# Patient Record
Sex: Male | Born: 1945 | Race: White | Hispanic: No | Marital: Married | Smoking: Never smoker
Health system: Southern US, Community
[De-identification: ages and names within clinical notes are randomized; demographics above are authoritative.]

## PROBLEM LIST (undated history)

## (undated) DIAGNOSIS — E785 Hyperlipidemia, unspecified: Secondary | ICD-10-CM

## (undated) DIAGNOSIS — R7303 Prediabetes: Secondary | ICD-10-CM

## (undated) DIAGNOSIS — F419 Anxiety disorder, unspecified: Secondary | ICD-10-CM

## (undated) DIAGNOSIS — I10 Essential (primary) hypertension: Secondary | ICD-10-CM

## (undated) DIAGNOSIS — M940 Chondrocostal junction syndrome [Tietze]: Secondary | ICD-10-CM

## (undated) DIAGNOSIS — M1990 Unspecified osteoarthritis, unspecified site: Secondary | ICD-10-CM

## (undated) HISTORY — DX: Unspecified osteoarthritis, unspecified site: M19.90

## (undated) HISTORY — PX: JOINT REPLACEMENT: SHX530

## (undated) HISTORY — DX: Essential (primary) hypertension: I10

## (undated) HISTORY — DX: Anxiety disorder, unspecified: F41.9

## (undated) HISTORY — PX: CARPAL TUNNEL RELEASE: SHX101

## (undated) HISTORY — PX: TONSILLECTOMY: SUR1361

## (undated) HISTORY — DX: Hyperlipidemia, unspecified: E78.5

---

## 2008-05-31 HISTORY — PX: COLON SURGERY: SHX602

## 2011-11-29 ENCOUNTER — Encounter: Payer: Self-pay | Admitting: Family Medicine

## 2011-11-29 ENCOUNTER — Ambulatory Visit (INDEPENDENT_AMBULATORY_CARE_PROVIDER_SITE_OTHER): Payer: BLUE CROSS/BLUE SHIELD | Admitting: Family Medicine

## 2011-11-29 VITALS — BP 140/92 | HR 65 | Temp 98.5°F | Ht 69.0 in | Wt 189.0 lb

## 2011-11-29 DIAGNOSIS — G8929 Other chronic pain: Secondary | ICD-10-CM

## 2011-11-29 DIAGNOSIS — R200 Anesthesia of skin: Secondary | ICD-10-CM | POA: Insufficient documentation

## 2011-11-29 DIAGNOSIS — R071 Chest pain on breathing: Secondary | ICD-10-CM

## 2011-11-29 DIAGNOSIS — M542 Cervicalgia: Secondary | ICD-10-CM

## 2011-11-29 DIAGNOSIS — R202 Paresthesia of skin: Secondary | ICD-10-CM

## 2011-11-29 DIAGNOSIS — R0789 Other chest pain: Secondary | ICD-10-CM

## 2011-11-29 DIAGNOSIS — R209 Unspecified disturbances of skin sensation: Secondary | ICD-10-CM

## 2011-11-29 DIAGNOSIS — I10 Essential (primary) hypertension: Secondary | ICD-10-CM

## 2011-11-29 MED ORDER — NORTRIPTYLINE HCL 50 MG PO CAPS: 50.0000 mg | ORAL_CAPSULE | Freq: Every day | ORAL | Status: DC

## 2011-11-29 NOTE — Patient Instructions (Signed)
It was nice to meet you.  I want you to try another medication that helps with nerve pain- called nortriptyline.  Take it at bedtime because it can make you sleepy.    I will make a referral for you to see an orthopedist.  The office will contact you with your appointment.  Please plan on an appointment with me in about 6 weeks to see how you are doing.

## 2011-11-29 NOTE — Assessment & Plan Note (Signed)
Description of pain is neuropathic, exam normal.  Rx Nortriptyline.

## 2011-11-29 NOTE — Assessment & Plan Note (Signed)
Strongly encouraged him to take BP medications every day.  Will monitor closely.

## 2011-11-29 NOTE — Assessment & Plan Note (Signed)
Unclear if this is from c-spine vs. Shoulder.  Spurling's neg.  Hx of c-spine arthritis and shoulder replacement.  Will refer to ortho for evaluation.

## 2011-11-29 NOTE — Progress Notes (Signed)
  Subjective:    Patient ID: Russell Peterson, male    DOB: July 07, 1945, 66 y.o.   MRN: [id number]  HPI  Russell Peterson comes in to establish care.  His main complaints are his neck/arm pain and numbness and tingling, as well as chest wall pain and tingling.    He says the neck and left arm pain has been going on for at least two years.  He has had x-rays of his neck that showed arthritis, but has also had nerve conductions studies that did not show a problem.  He describes the pain as burning, and shooting from his neck in to his left arm.  He says his arm often goes numb.  He had a carpel tunnel release for the numbness that did not help.  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  He has had a shoulder replacement in the left shoulder in 2010.    He has also had chest pain for several months.  He says he was recently seen by a cardiologist at [facility] and they did a stress test and said there was nothing wrong with his heart.  He describes the pain as all over his chest, and it feels sharp and tingling.    HTN- forgot to take medications last night, but says it is normally well controlled.    Past Medical History  Diagnosis Date  . Arthritis   . Anxiety   . Diabetes mellitus 2013    pre-diabetic  . Hyperlipidemia   . Hypertension    Family History  Problem Relation Age of Onset  . Heart disease Mother   . Kidney disease Mother   . Diabetes Father   . Stroke Father   . Diabetes Sister   . Hyperlipidemia Sister   . Hypertension Sister   . Stroke Sister   . Hypertension Daughter    History  Substance Use Topics  . Smoking status: Never Smoker   . Smokeless tobacco: Never Used  . Alcohol Use: 2.4 oz/week    4 Cans of beer per week    Review of Systems Pertinent items in HPI.     Objective:   Physical Exam BP 140/92  Pulse 65  Temp 98.5 F (36.9 C) (Oral)  Ht 5\' 9"  (1.753 m)  Wt 189 lb (85.73 kg)  BMI 27.91 kg/m2 General appearance:  alert, cooperative and no distress Neck: no adenopathy, no JVD, supple, symmetrical, trachea midline and thyroid not enlarged, symmetric, no tenderness/mass/nodules Spurling's test is negative.  Full ROM of neck.  Mild TTP in cervical spine. No deformity.  Back: symmetric, no curvature. ROM normal. No CVA tenderness., + mild tenderness to palpation in thoracic and lumbar spine. Lungs: clear to auscultation bilaterally Heart: regular rate and rhythm, S1, S2 normal, no murmur, click, rub or gallop Upper Extremities: 2+ pulses, sensation in tact.  Patient has 5/5 strength in RUE in all fields, but decreased strength in LUE for IR and ER. Full ROM, no rotator cuff impingement.  Lower Extremities: Strength is 5/5 and symmetric, sensation in tact, reflexes normal.       Assessment & Plan:

## 2011-11-29 NOTE — Assessment & Plan Note (Signed)
Will try to obtain outside records.  Will start nortriptyline in addition to gabapentin and tramadol for his pain.

## 2011-11-30 ENCOUNTER — Telehealth: Payer: Self-pay | Admitting: *Deleted

## 2011-11-30 NOTE — Telephone Encounter (Signed)
LMOVM asking pt to give [place] a call back.  When he calls back please let him know that he will need to contact [facility] orthopaedics before an appt can be made.  They need to talk to him about his previous surgery on his shoulder and let him know what records they need.  Marchelle Folks says that this would be easier than trying to relay a message.    [facility] [address], [address] Phone: [phone]

## 2011-12-03 ENCOUNTER — Telehealth: Payer: Self-pay | Admitting: Family Medicine

## 2011-12-03 NOTE — Telephone Encounter (Signed)
Yes ROI was signed at last OV, will wait for notes.

## 2011-12-03 NOTE — Telephone Encounter (Signed)
Russell Peterson,  You had him sign this ROI, right?

## 2011-12-03 NOTE — Telephone Encounter (Signed)
Patient is calling because Orthopaedics said they could not do a ROI to get from [facility].  I asked the patient to come in to [facility] to sign the ROI that we could then send to [facility] and once those records come in, he can either pick them up and take them to Ortho or if they aren't too many pages, then we might be able to fax them.  This is just an [place].

## 2011-12-16 ENCOUNTER — Telehealth: Payer: Self-pay | Admitting: Family Medicine

## 2011-12-16 NOTE — Telephone Encounter (Signed)
Please contact Russell Peterson to inform him if you have received his records from [facility] [place].  If you cannot reach him on his home phone number you can call him on his cell phone [phone].

## 2011-12-20 NOTE — Telephone Encounter (Signed)
Called patient to let him know- received some records from the [place], a series of shoulder x-rays that were done in 2010 after a shoulder replacement.  Informed patient that if he felt there were other records I should have he should contact us or the [place].

## 2011-12-21 ENCOUNTER — Telehealth: Payer: Self-pay | Admitting: Family Medicine

## 2011-12-21 DIAGNOSIS — R202 Paresthesia of skin: Secondary | ICD-10-CM

## 2011-12-21 DIAGNOSIS — Z9889 Other specified postprocedural states: Secondary | ICD-10-CM

## 2011-12-21 DIAGNOSIS — M542 Cervicalgia: Secondary | ICD-10-CM

## 2011-12-21 NOTE — Telephone Encounter (Signed)
The medical records that Dr. Lula Olszewski received on his shoulder xray from 2010 are the only ones that she will need to move forward with the referral to the Orthopaedic Surgeon.

## 2011-12-27 DIAGNOSIS — Z9889 Other specified postprocedural states: Secondary | ICD-10-CM | POA: Insufficient documentation

## 2011-12-27 NOTE — Telephone Encounter (Signed)
Referral has been made.  I am not scanning his shoulder x-rays as I do not think they will add to his care.

## 2012-03-31 ENCOUNTER — Other Ambulatory Visit: Payer: Self-pay | Admitting: Orthopedic Surgery

## 2012-03-31 DIAGNOSIS — M542 Cervicalgia: Secondary | ICD-10-CM

## 2012-04-01 ENCOUNTER — Ambulatory Visit
Admission: RE | Admit: 2012-04-01 | Discharge: 2012-04-01 | Disposition: A | Discharge status: Home / Self Care | Payer: Medicare Other | Source: Ambulatory Visit | Attending: Orthopedic Surgery | Admitting: Orthopedic Surgery

## 2012-04-01 ENCOUNTER — Other Ambulatory Visit: Payer: Self-pay | Admitting: Orthopedic Surgery

## 2012-04-01 DIAGNOSIS — M542 Cervicalgia: Secondary | ICD-10-CM

## 2012-04-01 DIAGNOSIS — IMO0002 Reserved for concepts with insufficient information to code with codable children: Secondary | ICD-10-CM

## 2012-04-05 ENCOUNTER — Other Ambulatory Visit: Payer: Self-pay | Admitting: Orthopedic Surgery

## 2012-04-19 ENCOUNTER — Encounter (HOSPITAL_COMMUNITY): Payer: Self-pay | Admitting: Pharmacy Technician

## 2012-04-20 ENCOUNTER — Encounter (HOSPITAL_COMMUNITY): Payer: Self-pay

## 2012-04-20 ENCOUNTER — Encounter (HOSPITAL_COMMUNITY)
Admission: RE | Admit: 2012-04-20 | Discharge: 2012-04-20 | Disposition: A | Discharge status: Home / Self Care | Payer: Medicare Other | Source: Ambulatory Visit | Attending: Orthopedic Surgery | Admitting: Orthopedic Surgery

## 2012-04-20 LAB — CBC WITH DIFFERENTIAL/PLATELET
Eosinophils Absolute: 0.3 10*3/uL (ref 0.0–0.7)
Eosinophils Relative: 2 % (ref 0–5)
HCT: 46.8 % (ref 39.0–52.0)
Lymphocytes Relative: 22 % (ref 12–46)
Lymphs Abs: 2.3 10*3/uL (ref 0.7–4.0)
MCH: 29.7 pg (ref 26.0–34.0)
MCV: 86.2 fL (ref 78.0–100.0)
Monocytes Absolute: 0.7 10*3/uL (ref 0.1–1.0)
RBC: 5.43 MIL/uL (ref 4.22–5.81)
RDW: 12.5 % (ref 11.5–15.5)
WBC: 10.6 10*3/uL — ABNORMAL HIGH (ref 4.0–10.5)

## 2012-04-20 LAB — URINALYSIS, ROUTINE W REFLEX MICROSCOPIC
Bilirubin Urine: NEGATIVE
Glucose, UA: NEGATIVE mg/dL
Hgb urine dipstick: NEGATIVE
Ketones, ur: NEGATIVE mg/dL
Protein, ur: NEGATIVE mg/dL

## 2012-04-20 LAB — COMPREHENSIVE METABOLIC PANEL
CO2: 31 mEq/L (ref 19–32)
Calcium: 10.3 mg/dL (ref 8.4–10.5)
Creatinine, Ser: 1 mg/dL (ref 0.50–1.35)
GFR calc Af Amer: 89 mL/min — ABNORMAL LOW (ref >90)
GFR calc non Af Amer: 77 mL/min — ABNORMAL LOW (ref >90)
Glucose, Bld: 94 mg/dL (ref 70–99)

## 2012-04-20 LAB — ABO/RH: ABO/RH(D): B POS

## 2012-04-20 LAB — PROTIME-INR
INR: 0.95 (ref 0.00–1.49)
Prothrombin Time: 12.6 seconds (ref 11.6–15.2)

## 2012-04-20 LAB — TYPE AND SCREEN: ABO/RH(D): B POS

## 2012-04-20 NOTE — Pre-Procedure Instructions (Signed)
20 Russell Peterson  04/20/2012   Your procedure is scheduled on:  04/26/12  Report to [facility] at 840 AM.  Call this number if you have problems the morning of surgery: [phone]   Remember:   Do not eat food:After Midnight.    Take these medicines the morning of surgery with A SIP OF WATER: norvasc,neurontin,ultram   Do not wear jewelry, make-up or nail polish.  Do not wear lotions, powders, or perfumes. You may wear deodorant.  Do not shave 48 hours prior to surgery. Men may shave face and neck.  Do not bring valuables to the hospital.  Contacts, dentures or bridgework may not be worn into surgery.  Leave suitcase in the car. After surgery it may be brought to your room.  For patients admitted to the hospital, checkout time is 11:00 AM the day of discharge.   Patients discharged the day of surgery will not be allowed to drive home.  Name and phone number of your driver: family  Special Instructions: Shower using CHG 2 nights before surgery and the night before surgery.  If you shower the day of surgery use CHG.  Use special wash - you have one bottle of CHG for all showers.  You should use approximately 1/3 of the bottle for each shower.   Please read over the following fact sheets that you were given: Pain Booklet, Coughing and Deep Breathing, MRSA Information and Surgical Site Infection Prevention

## 2012-04-20 NOTE — Progress Notes (Signed)
Stress test in results in chart

## 2012-04-25 MED ORDER — CEFAZOLIN SODIUM-DEXTROSE 2-3 GM-% IV SOLR
2.0000 g | INTRAVENOUS | Status: AC
  Administered 2012-04-26 (×2): 2 g via INTRAVENOUS
  Filled 2012-04-25: qty 50

## 2012-04-25 NOTE — Progress Notes (Signed)
Attempted to call pt with new arrival time for surgery, however no answer on home number/no voicemail and mobile number has no voicemail at either number [phone] or [phone]).  Called office to notify unable to reach pt. Left message for Albin Felling.

## 2012-04-26 ENCOUNTER — Encounter (HOSPITAL_COMMUNITY): Payer: Self-pay | Admitting: Certified Registered"

## 2012-04-26 ENCOUNTER — Inpatient Hospital Stay (HOSPITAL_COMMUNITY)
Admission: RE | Admit: 2012-04-26 | Discharge: 2012-04-27 | DRG: 473 | Disposition: A | Discharge status: Home / Self Care | Payer: Medicare Other | Source: Ambulatory Visit | Attending: Orthopedic Surgery | Admitting: Orthopedic Surgery

## 2012-04-26 ENCOUNTER — Encounter (HOSPITAL_COMMUNITY)
Admission: RE | Disposition: A | Discharge status: Home / Self Care | Payer: Self-pay | Source: Ambulatory Visit | Attending: Orthopedic Surgery

## 2012-04-26 ENCOUNTER — Encounter (HOSPITAL_COMMUNITY): Payer: Self-pay | Admitting: *Deleted

## 2012-04-26 ENCOUNTER — Ambulatory Visit (HOSPITAL_COMMUNITY): Payer: Medicare Other | Admitting: Certified Registered"

## 2012-04-26 ENCOUNTER — Ambulatory Visit (HOSPITAL_COMMUNITY): Payer: Medicare Other

## 2012-04-26 DIAGNOSIS — Z96619 Presence of unspecified artificial shoulder joint: Secondary | ICD-10-CM

## 2012-04-26 DIAGNOSIS — M5030 Other cervical disc degeneration, unspecified cervical region: Principal | ICD-10-CM | POA: Diagnosis present

## 2012-04-26 DIAGNOSIS — I10 Essential (primary) hypertension: Secondary | ICD-10-CM | POA: Diagnosis present

## 2012-04-26 DIAGNOSIS — M129 Arthropathy, unspecified: Secondary | ICD-10-CM | POA: Diagnosis present

## 2012-04-26 DIAGNOSIS — Z96659 Presence of unspecified artificial knee joint: Secondary | ICD-10-CM

## 2012-04-26 DIAGNOSIS — F411 Generalized anxiety disorder: Secondary | ICD-10-CM | POA: Diagnosis present

## 2012-04-26 DIAGNOSIS — E785 Hyperlipidemia, unspecified: Secondary | ICD-10-CM | POA: Diagnosis present

## 2012-04-26 HISTORY — PX: ANTERIOR CERVICAL DECOMPRESSION/DISCECTOMY FUSION 4 LEVELS: SHX5556

## 2012-04-26 LAB — GLUCOSE, CAPILLARY: Glucose-Capillary: 119 mg/dL — ABNORMAL HIGH (ref 70–99)

## 2012-04-26 SURGERY — ANTERIOR CERVICAL DECOMPRESSION/DISCECTOMY FUSION 4 LEVELS
Anesthesia: General | Site: Spine Cervical | Laterality: Bilateral | Wound class: Clean

## 2012-04-26 MED ORDER — BUPIVACAINE-EPINEPHRINE 0.25% -1:200000 IJ SOLN
INTRAMUSCULAR | Status: DC | PRN
  Administered 2012-04-26: 2 mL

## 2012-04-26 MED ORDER — SODIUM CHLORIDE 0.9 % IJ SOLN: 3.0000 mL | INTRAMUSCULAR | Status: DC | PRN

## 2012-04-26 MED ORDER — OXYCODONE HCL 5 MG PO TABS: 5.0000 mg | ORAL_TABLET | Freq: Once | ORAL | Status: DC | PRN

## 2012-04-26 MED ORDER — PROPOFOL 10 MG/ML IV BOLUS
INTRAVENOUS | Status: DC | PRN
  Administered 2012-04-26: 150 mg via INTRAVENOUS
  Administered 2012-04-26: 50 mg via INTRAVENOUS

## 2012-04-26 MED ORDER — CEFAZOLIN SODIUM 1-5 GM-% IV SOLN
INTRAVENOUS | Status: AC
  Filled 2012-04-26: qty 100

## 2012-04-26 MED ORDER — DOCUSATE SODIUM 100 MG PO CAPS
100.0000 mg | ORAL_CAPSULE | Freq: Two times a day (BID) | ORAL | Status: DC
  Administered 2012-04-26 – 2012-04-27 (×2): 100 mg via ORAL
  Filled 2012-04-26 (×3): qty 1

## 2012-04-26 MED ORDER — PHENOL 1.4 % MT LIQD: 1.0000 | OROMUCOSAL | Status: DC | PRN

## 2012-04-26 MED ORDER — SENNA 8.6 MG PO TABS
1.0000 | ORAL_TABLET | Freq: Two times a day (BID) | ORAL | Status: DC
  Administered 2012-04-26 – 2012-04-27 (×2): 8.6 mg via ORAL
  Filled 2012-04-26 (×4): qty 1

## 2012-04-26 MED ORDER — BUPIVACAINE-EPINEPHRINE PF 0.25-1:200000 % IJ SOLN
INTRAMUSCULAR | Status: AC
  Filled 2012-04-26: qty 30

## 2012-04-26 MED ORDER — LACTATED RINGERS IV SOLN
INTRAVENOUS | Status: DC | PRN
  Administered 2012-04-26 (×3): via INTRAVENOUS

## 2012-04-26 MED ORDER — POTASSIUM CHLORIDE IN NACL 20-0.9 MEQ/L-% IV SOLN
INTRAVENOUS | Status: DC
  Administered 2012-04-26: 80 mL via INTRAVENOUS
  Filled 2012-04-26 (×3): qty 1000

## 2012-04-26 MED ORDER — MORPHINE SULFATE 2 MG/ML IJ SOLN
2.0000 mg | INTRAMUSCULAR | Status: DC | PRN
  Administered 2012-04-26 – 2012-04-27 (×2): 2 mg via INTRAVENOUS
  Filled 2012-04-26 (×2): qty 1

## 2012-04-26 MED ORDER — ALUM & MAG HYDROXIDE-SIMETH 200-200-20 MG/5ML PO SUSP: 30.0000 mL | Freq: Four times a day (QID) | ORAL | Status: DC | PRN

## 2012-04-26 MED ORDER — DIAZEPAM 5 MG PO TABS
5.0000 mg | ORAL_TABLET | Freq: Four times a day (QID) | ORAL | Status: DC | PRN
  Administered 2012-04-26 – 2012-04-27 (×3): 5 mg via ORAL
  Filled 2012-04-26 (×3): qty 1

## 2012-04-26 MED ORDER — PHENYLEPHRINE HCL 10 MG/ML IJ SOLN
INTRAMUSCULAR | Status: DC | PRN
  Administered 2012-04-26 (×5): 80 ug via INTRAVENOUS

## 2012-04-26 MED ORDER — PHENYLEPHRINE HCL 10 MG/ML IJ SOLN
10.0000 mg | INTRAMUSCULAR | Status: DC | PRN
  Administered 2012-04-26: 40 ug/min via INTRAVENOUS

## 2012-04-26 MED ORDER — ONDANSETRON HCL 4 MG/2ML IJ SOLN
INTRAMUSCULAR | Status: DC | PRN
  Administered 2012-04-26: 4 mg via INTRAVENOUS

## 2012-04-26 MED ORDER — THROMBIN 20000 UNITS EX SOLR
OROMUCOSAL | Status: DC | PRN
  Administered 2012-04-26: 10:00:00 via TOPICAL

## 2012-04-26 MED ORDER — ACETAMINOPHEN 650 MG RE SUPP: 650.0000 mg | RECTAL | Status: DC | PRN

## 2012-04-26 MED ORDER — WHITE PETROLATUM GEL
Status: AC
  Filled 2012-04-26: qty 5

## 2012-04-26 MED ORDER — MENTHOL 3 MG MT LOZG: 1.0000 | LOZENGE | OROMUCOSAL | Status: DC | PRN

## 2012-04-26 MED ORDER — EPHEDRINE SULFATE 50 MG/ML IJ SOLN
INTRAMUSCULAR | Status: DC | PRN
  Administered 2012-04-26 (×5): 10 mg via INTRAVENOUS

## 2012-04-26 MED ORDER — MIDAZOLAM HCL 2 MG/2ML IJ SOLN: 0.5000 mg | Freq: Once | INTRAMUSCULAR | Status: DC | PRN

## 2012-04-26 MED ORDER — POVIDONE-IODINE 7.5 % EX SOLN
Freq: Once | CUTANEOUS | Status: DC
  Filled 2012-04-26: qty 118

## 2012-04-26 MED ORDER — OXYCODONE-ACETAMINOPHEN 5-325 MG PO TABS
1.0000 | ORAL_TABLET | ORAL | Status: DC | PRN
  Administered 2012-04-26 – 2012-04-27 (×5): 2 via ORAL
  Filled 2012-04-26 (×4): qty 2

## 2012-04-26 MED ORDER — SODIUM CHLORIDE 0.9 % IJ SOLN: 3.0000 mL | Freq: Two times a day (BID) | INTRAMUSCULAR | Status: DC

## 2012-04-26 MED ORDER — ONDANSETRON HCL 4 MG/2ML IJ SOLN: 4.0000 mg | INTRAMUSCULAR | Status: DC | PRN

## 2012-04-26 MED ORDER — PROMETHAZINE HCL 25 MG/ML IJ SOLN: 6.2500 mg | INTRAMUSCULAR | Status: DC | PRN

## 2012-04-26 MED ORDER — THROMBIN 20000 UNITS EX SOLR
CUTANEOUS | Status: AC
  Filled 2012-04-26: qty 40000

## 2012-04-26 MED ORDER — CEFAZOLIN SODIUM 1-5 GM-% IV SOLN
1.0000 g | Freq: Three times a day (TID) | INTRAVENOUS | Status: AC
  Administered 2012-04-26 – 2012-04-27 (×2): 1 g via INTRAVENOUS
  Filled 2012-04-26 (×3): qty 50

## 2012-04-26 MED ORDER — HYDROMORPHONE HCL PF 1 MG/ML IJ SOLN
0.2500 mg | INTRAMUSCULAR | Status: DC | PRN
  Administered 2012-04-26 (×2): 0.5 mg via INTRAVENOUS

## 2012-04-26 MED ORDER — SUFENTANIL CITRATE 50 MCG/ML IV SOLN
INTRAVENOUS | Status: DC | PRN
  Administered 2012-04-26: 20 ug via INTRAVENOUS
  Administered 2012-04-26 (×2): 10 ug via INTRAVENOUS
  Administered 2012-04-26: 5 ug via INTRAVENOUS
  Administered 2012-04-26: 10 ug via INTRAVENOUS
  Administered 2012-04-26: 5 ug via INTRAVENOUS
  Administered 2012-04-26 (×2): 10 ug via INTRAVENOUS
  Administered 2012-04-26: 20 ug via INTRAVENOUS
  Administered 2012-04-26: 10 ug via INTRAVENOUS

## 2012-04-26 MED ORDER — OXYCODONE-ACETAMINOPHEN 5-325 MG PO TABS
ORAL_TABLET | ORAL | Status: AC
  Filled 2012-04-26: qty 2

## 2012-04-26 MED ORDER — GELATIN ABSORBABLE MT POWD
OROMUCOSAL | Status: DC | PRN
  Administered 2012-04-26: 14:00:00 via TOPICAL

## 2012-04-26 MED ORDER — ACETAMINOPHEN 325 MG PO TABS: 650.0000 mg | ORAL_TABLET | ORAL | Status: DC | PRN

## 2012-04-26 MED ORDER — MEPERIDINE HCL 25 MG/ML IJ SOLN: 6.2500 mg | INTRAMUSCULAR | Status: DC | PRN

## 2012-04-26 MED ORDER — HYDROMORPHONE HCL PF 1 MG/ML IJ SOLN
INTRAMUSCULAR | Status: AC
  Filled 2012-04-26: qty 1

## 2012-04-26 MED ORDER — ROCURONIUM BROMIDE 100 MG/10ML IV SOLN
INTRAVENOUS | Status: DC | PRN
  Administered 2012-04-26: 50 mg via INTRAVENOUS

## 2012-04-26 MED ORDER — SODIUM CHLORIDE 0.9 % IV SOLN: 250.0000 mL | INTRAVENOUS | Status: DC

## 2012-04-26 MED ORDER — 0.9 % SODIUM CHLORIDE (POUR BTL) OPTIME
TOPICAL | Status: DC | PRN
  Administered 2012-04-26 (×2): 1000 mL

## 2012-04-26 MED ORDER — OXYCODONE HCL 5 MG/5ML PO SOLN: 5.0000 mg | Freq: Once | ORAL | Status: DC | PRN

## 2012-04-26 MED ORDER — ZOLPIDEM TARTRATE 5 MG PO TABS: 5.0000 mg | ORAL_TABLET | Freq: Every evening | ORAL | Status: DC | PRN

## 2012-04-26 MED ORDER — LIDOCAINE HCL (CARDIAC) 20 MG/ML IV SOLN
INTRAVENOUS | Status: DC | PRN
  Administered 2012-04-26: 30 mg via INTRAVENOUS

## 2012-04-26 MED ORDER — MIDAZOLAM HCL 5 MG/5ML IJ SOLN
INTRAMUSCULAR | Status: DC | PRN
  Administered 2012-04-26 (×2): 2 mg via INTRAVENOUS

## 2012-04-26 SURGICAL SUPPLY — 82 items
BENZOIN TINCTURE PRP APPL 2/3 (GAUZE/BANDAGES/DRESSINGS) ×2 IMPLANT
BIT DRILL NEURO 2X3.1 SFT TUCH (MISCELLANEOUS) ×3 IMPLANT
BLADE LONG MED 31X9 (MISCELLANEOUS) IMPLANT
BLADE SURG 15 STRL LF DISP TIS (BLADE) ×1 IMPLANT
BLADE SURG 15 STRL SS (BLADE) ×1
BLADE SURG ROTATE 9660 (MISCELLANEOUS) ×2 IMPLANT
BUR NEURO DRILL SOFT 3.0X3.8M (BURR) ×2 IMPLANT
CARTRIDGE OIL MAESTRO DRILL (MISCELLANEOUS) ×1 IMPLANT
CLOTH BEACON ORANGE TIMEOUT ST (SAFETY) ×2 IMPLANT
CLSR STERI-STRIP ANTIMIC 1/2X4 (GAUZE/BANDAGES/DRESSINGS) ×2 IMPLANT
COLLAR CERV LO CONTOUR FIRM DE (SOFTGOODS) IMPLANT
CORDS BIPOLAR (ELECTRODE) ×4 IMPLANT
COVER MAYO STAND STRL (DRAPES) ×2 IMPLANT
COVER SURGICAL LIGHT HANDLE (MISCELLANEOUS) ×2 IMPLANT
CRADLE DONUT ADULT HEAD (MISCELLANEOUS) ×2 IMPLANT
DEVICE ENDSKLTN CRVCL 5MM-0MED (Orthopedic Implant) ×2 IMPLANT
DEVICE ENDSKLTN CRVCL 5MM-0SM (Orthopedic Implant) ×2 IMPLANT
DIFFUSER DRILL AIR PNEUMATIC (MISCELLANEOUS) ×2 IMPLANT
DRAIN JACKSON RD 7FR 3/32 (WOUND CARE) ×2 IMPLANT
DRAPE C-ARM 42X72 X-RAY (DRAPES) ×2 IMPLANT
DRAPE ORTHO SPLIT 77X108 STRL (DRAPES) ×2
DRAPE POUCH INSTRU U-SHP 10X18 (DRAPES) ×2 IMPLANT
DRAPE SURG 17X23 STRL (DRAPES) ×6 IMPLANT
DRAPE SURG ORHT 6 SPLT 77X108 (DRAPES) ×2 IMPLANT
DRILL BIT (BIT) ×2 IMPLANT
DRILL NEURO 2X3.1 SOFT TOUCH (MISCELLANEOUS) ×6
DURAPREP 6ML APPLICATOR 50/CS (WOUND CARE) ×2 IMPLANT
ELECT COATED BLADE 2.86 ST (ELECTRODE) ×4 IMPLANT
ELECT REM PT RETURN 9FT ADLT (ELECTROSURGICAL) ×4
ELECTRODE REM PT RTRN 9FT ADLT (ELECTROSURGICAL) ×2 IMPLANT
ENDOSKELETON CERVICAL 5MM-0MED (Orthopedic Implant) ×4 IMPLANT
ENDOSKELETON CERVICAL 5MM-0SM (Orthopedic Implant) ×4 IMPLANT
EVACUATOR SILICONE 100CC (DRAIN) ×2 IMPLANT
GAUZE SPONGE 4X4 16PLY XRAY LF (GAUZE/BANDAGES/DRESSINGS) ×4 IMPLANT
GLOVE BIO SURGEON STRL SZ8 (GLOVE) ×4 IMPLANT
GLOVE BIOGEL PI IND STRL 8 (GLOVE) ×2 IMPLANT
GLOVE BIOGEL PI INDICATOR 8 (GLOVE) ×2
GLOVE SURG SS PI 7.0 STRL IVOR (GLOVE) ×8 IMPLANT
GOWN PREVENTION PLUS XLARGE (GOWN DISPOSABLE) ×8 IMPLANT
GOWN STRL NON-REIN LRG LVL3 (GOWN DISPOSABLE) ×4 IMPLANT
IV CATH 14GX2 1/4 (CATHETERS) ×2 IMPLANT
KIT BASIN OR (CUSTOM PROCEDURE TRAY) ×4 IMPLANT
KIT ROOM TURNOVER OR (KITS) ×2 IMPLANT
MANIFOLD NEPTUNE II (INSTRUMENTS) ×2 IMPLANT
NEEDLE 27GAX1X1/2 (NEEDLE) ×2 IMPLANT
NEEDLE SPNL 20GX3.5 QUINCKE YW (NEEDLE) ×2 IMPLANT
NS IRRIG 1000ML POUR BTL (IV SOLUTION) ×4 IMPLANT
OIL CARTRIDGE MAESTRO DRILL (MISCELLANEOUS) ×2
PACK ORTHO CERVICAL (CUSTOM PROCEDURE TRAY) ×2 IMPLANT
PAD ARMBOARD 7.5X6 YLW CONV (MISCELLANEOUS) ×4 IMPLANT
PATTIES SURGICAL .5 X.5 (GAUZE/BANDAGES/DRESSINGS) ×4 IMPLANT
PATTIES SURGICAL .5 X1 (DISPOSABLE) ×2 IMPLANT
PIN DISTRACTION 14 (PIN) ×4 IMPLANT
PLATE VECTRA 4 LEVEL (Plate) ×2 IMPLANT
PUTTY BONE DBX 2.5 MIS (Bone Implant) ×2 IMPLANT
PUTTY BONE DBX 5CC MIX (Putty) ×2 IMPLANT
SCREW 4.0X16MM (Screw) ×12 IMPLANT
SPONGE GAUZE 4X4 12PLY (GAUZE/BANDAGES/DRESSINGS) ×2 IMPLANT
SPONGE INTESTINAL PEANUT (DISPOSABLE) ×4 IMPLANT
SPONGE SURGIFOAM ABS GEL 100 (HEMOSTASIS) ×2 IMPLANT
STRIP CLOSURE SKIN 1/2X4 (GAUZE/BANDAGES/DRESSINGS) ×2 IMPLANT
SURGIFLO TRUKIT (HEMOSTASIS) IMPLANT
SUT BONE WAX W31G (SUTURE) ×4 IMPLANT
SUT ETHILON 3 0 FSL (SUTURE) IMPLANT
SUT MNCRL AB 4-0 PS2 18 (SUTURE) ×4 IMPLANT
SUT PROLENE 3 0 PS 2 (SUTURE) IMPLANT
SUT SILK 4 0 (SUTURE)
SUT SILK 4-0 18XBRD TIE 12 (SUTURE) IMPLANT
SUT VIC AB 2-0 CT2 18 VCP726D (SUTURE) ×4 IMPLANT
SYR BULB IRRIGATION 50ML (SYRINGE) ×4 IMPLANT
SYR CONTROL 10ML LL (SYRINGE) ×2 IMPLANT
TAPE CLOTH 4X10 WHT NS (GAUZE/BANDAGES/DRESSINGS) ×2 IMPLANT
TAPE CLOTH SURG 4X10 WHT LF (GAUZE/BANDAGES/DRESSINGS) ×2 IMPLANT
TAPE UMBILICAL COTTON 1/8X30 (MISCELLANEOUS) ×4 IMPLANT
TOWEL OR 17X24 6PK STRL BLUE (TOWEL DISPOSABLE) ×2 IMPLANT
TOWEL OR 17X26 10 PK STRL BLUE (TOWEL DISPOSABLE) ×4 IMPLANT
TRAY FOLEY CATH 14FR (SET/KITS/TRAYS/PACK) ×2 IMPLANT
TUBING SUCTION BULK 100 FT (MISCELLANEOUS) ×2 IMPLANT
WATER STERILE IRR 1000ML POUR (IV SOLUTION) ×2 IMPLANT
YANKAUER SUCT BULB TIP NO VENT (SUCTIONS) ×4 IMPLANT
vectra fixed angle rescue screw 16mm (Screw) ×2 IMPLANT
vectra fixed angle screw 16mm (Screw) ×8 IMPLANT

## 2012-04-26 NOTE — Plan of Care (Signed)
Problem: Consults Goal: Diagnosis - Spinal Surgery Cervical Spine Fusion     

## 2012-04-26 NOTE — Anesthesia Preprocedure Evaluation (Addendum)
Anesthesia Evaluation  Patient identified by MRN, date of birth, ID band Patient awake    Reviewed: Allergy & Precautions, H&P , NPO status , Patient's Chart, lab work & pertinent test results  History of Anesthesia Complications Negative for: history of anesthetic complications  Airway Mallampati: II TM Distance: >3 FB Neck ROM: Full    Dental  (+) Teeth Intact and Dental Advisory Given   Pulmonary neg pulmonary ROS,  breath sounds clear to auscultation  Pulmonary exam normal       Cardiovascular hypertension, Pt. on medications Rhythm:Regular Rate:Normal  6/13 Stress test: no ischemia, EF 55%   Neuro/Psych Anxiety Bilateral arm pain: tramadol    GI/Hepatic negative GI ROS, Neg liver ROS,   Endo/Other  negative endocrine ROSneg diabetes  Renal/GU negative Renal ROS     Musculoskeletal   Abdominal Normal abdominal exam  (+) - obese,   Peds  Hematology   Anesthesia Other Findings   Reproductive/Obstetrics                          Anesthesia Physical Anesthesia Plan  ASA: II  Anesthesia Plan: General   Post-op Pain Management:    Induction: Intravenous  Airway Management Planned: Oral ETT  Additional Equipment:   Intra-op Plan:   Post-operative Plan: Extubation in OR  Informed Consent: I have reviewed the patients History and Physical, chart, labs and discussed the procedure including the risks, benefits and alternatives for the proposed anesthesia with the patient or authorized representative who has indicated his/her understanding and acceptance.   Dental advisory given  Plan Discussed with: CRNA, Anesthesiologist and Surgeon  Anesthesia Plan Comments: (Plan routine monitors, GETA )       Anesthesia Quick Evaluation

## 2012-04-26 NOTE — Progress Notes (Signed)
PA visited at bedside

## 2012-04-26 NOTE — Anesthesia Postprocedure Evaluation (Signed)
  Anesthesia Post-op Note  Patient: Russell Peterson  Procedure(s) Performed: Procedure(s) (LRB) with comments: ANTERIOR CERVICAL DECOMPRESSION/DISCECTOMY FUSION 4 LEVELS (Bilateral) - Anterior cervical decompression fusion, cervical 3-4, cervical 4-5, cervical 5-6, cervical 6-7 with instrumentation and allograft.  Patient Location: PACU  Anesthesia Type:General  Level of Consciousness: awake  Airway and Oxygen Therapy: Patient Spontanous Breathing and Patient connected to nasal cannula oxygen  Post-op Pain: mild  Post-op Assessment: Post-op Vital signs reviewed, Patient's Cardiovascular Status Stable, Respiratory Function Stable, Patent Airway and No signs of Nausea or vomiting  Post-op Vital Signs: Reviewed and stable  Complications: No apparent anesthesia complications

## 2012-04-26 NOTE — H&P (Signed)
PREOPERATIVE H&P  Chief Complaint: neck pain and bilateral arm pain  HPI: Russell Peterson is a 66 y.o. male who presents with bilateral arm pain  Past Medical History  Diagnosis Date  . Arthritis   . Anxiety   . Hyperlipidemia   . Diabetes mellitus 2013    pre-diabetic    no meds  . Hypertension     dr Gordy Councilman    in [place]  [place]   Past Surgical History  Procedure Date  . Colon surgery 2010    partial colon resection secondary diverticulitis.   . Carpal tunnel release   . Joint replacement     L shoulder, R knee  . Tonsillectomy    History   Social History  . Marital Status: Married    Spouse Name: N/A    Number of Children: N/A  . Years of Education: N/A   Social History Main Topics  . Smoking status: Never Smoker   . Smokeless tobacco: Never Used  . Alcohol Use: 2.4 oz/week    4 Cans of beer per week     Comment: socially  . Drug Use: No  . Sexually Active: None   Other Topics Concern  . None   Social History Narrative  . None   Family History  Problem Relation Age of Onset  . Heart disease Mother   . Kidney disease Mother   . Diabetes Father   . Stroke Father   . Diabetes Sister   . Hyperlipidemia Sister   . Hypertension Sister   . Stroke Sister   . Hypertension Daughter    No Known Allergies Prior to Admission medications   Medication Sig Start Date End Date Taking? Authorizing Provider  amLODipine (NORVASC) 5 MG tablet Take 5 mg by mouth daily.   Yes Historical Provider, MD  gabapentin (NEURONTIN) 400 MG capsule Take 800 mg by mouth 4 (four) times daily.    Yes Historical Provider, MD  lisinopril-hydrochlorothiazide (PRINZIDE,ZESTORETIC) 20-25 MG per tablet Take 1 tablet by mouth daily.   Yes Historical Provider, MD  nortriptyline (PAMELOR) 50 MG capsule Take 50 mg by mouth at bedtime.   Yes Historical Provider, MD  traMADol (ULTRAM) 50 MG tablet Take 100 mg by mouth every 6 (six) hours as needed. For pain.   Yes Historical Provider, MD    vitamin E 400 UNIT capsule Take 400 Units by mouth daily.   Yes Historical Provider, MD  zolpidem (AMBIEN) 10 MG tablet Take 10 mg by mouth at bedtime as needed. For sleep.   Yes Historical Provider, MD  aspirin EC 81 MG tablet Take 81 mg by mouth daily.    Historical Provider, MD  diclofenac (VOLTAREN) 50 MG EC tablet Take 100 mg by mouth 2 (two) times daily.    Historical Provider, MD     All other systems have been reviewed and were otherwise negative with the exception of those mentioned in the HPI and as above.  Physical Exam: Filed Vitals:   04/26/12 0619  BP: 133/90  Pulse: 83  Temp: 98 F (36.7 C)  Resp: 20    General: Alert, no acute distress Cardiovascular: No pedal edema Respiratory: No cyanosis, no use of accessory musculature GI: No organomegaly, abdomen is soft and non-tender Skin: No lesions in the area of chief complaint Neurologic: Sensation intact distally Psychiatric: Patient is competent for consent with normal mood and affect Lymphatic: No axillary or cervical lymphadenopathy  MUSCULOSKELETAL: + b/l spurling's sign  Assessment/Plan: Bilateral arm pain Plan  for Procedure(s): ANTERIOR CERVICAL DECOMPRESSION/DISCECTOMY FUSION 4 LEVELS   Emilee Hero, MD 04/26/2012 6:51 AM

## 2012-04-26 NOTE — Preoperative (Signed)
Beta Blockers   Reason not to administer Beta Blockers:Not Applicable 

## 2012-04-26 NOTE — Transfer of Care (Signed)
Immediate Anesthesia Transfer of Care Note  Patient: Russell Peterson  Procedure(s) Performed: Procedure(s) (LRB) with comments: ANTERIOR CERVICAL DECOMPRESSION/DISCECTOMY FUSION 4 LEVELS (Bilateral) - Anterior cervical decompression fusion, cervical 3-4, cervical 4-5, cervical 5-6, cervical 6-7 with instrumentation and allograft.  Patient Location: PACU  Anesthesia Type:General  Level of Consciousness: awake, alert  and oriented  Airway & Oxygen Therapy: Patient Spontanous Breathing and Patient connected to nasal cannula oxygen  Post-op Assessment: Report given to PACU RN, Post -op Vital signs reviewed and stable and Patient moving all extremities  Post vital signs: Reviewed and stable  Complications: No apparent anesthesia complications

## 2012-04-27 NOTE — Progress Notes (Signed)
Pt doing very well, resolved arm pain, improved neck pain, some posterior muscular tightness, minimal swallowing difficulties/hoarseness. Ready to go home  BP 143/83  Pulse 93  Temp 98.6 F (37 C) (Oral)  Resp 18  SpO2 96%  Pt up in chair, hard collar fitting appropriately, bandage CDI, strength 5/5 BIL UE, neurovascularly intact   Doing well PO day 1 after 4 level ACDF, pain well controlled  D/c home today Rx for percocet and valium provided to pt as well as [place] collar for showering,  f/u in office 2wks

## 2012-04-27 NOTE — Op Note (Signed)
NAMEBANE, HAGY NO.:  [id number]  MEDICAL RECORD NO.:  [id number]  LOCATION:  5N20C                        FACILITY:  [facility]  PHYSICIAN:  Estill Bamberg, MD      DATE OF BIRTH:  May 11, 1946  DATE OF PROCEDURE:  04/26/2012 DATE OF DISCHARGE:                              OPERATIVE REPORT   PREOPERATIVE DIAGNOSES: 1. 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 2. Bilateral cervical radiculopathy.  POSTOP DIAGNOSIS: 1. 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 2. Bilateral cervical radiculopathy.  PROCEDURE: 1. C3-4, C4-5, C5-6, C6-7 anterior cervical decompression and fusion. 2. Placement of anterior instrumentation C3-C4, C5-C6, and C7. 3. Placement of interbody device x4 (5 mm parallel type interbody     spacers x4). 4. Use of local autograft. 5. Use of morselized allograft (DBX mix). 6. Intraoperative use of fluoroscopy.  SURGEON:  Estill Bamberg, MD  ASSISTANT:  Leatrice Jewels, [facility]  ANESTHESIA:  General endotracheal anesthesia.  COMPLICATIONS:  None.  DISPOSITION:  Stable.  ESTIMATED BLOOD LOSS:  Minimal.  INDICATIONS FOR PROCEDURE:  Briefly, Mr. Russell Peterson is a very pleasant 66 year old male who presented to me with severe pain in both his right and his left arms.  I did review an MRI which was notable for profound degenerative disk disease at the C3-4 disk to C6-7 levels.  An MRI was also reviewed which was notable for varying degrees of rather severe neuroforaminal stenosis throughout the patient's subaxial cervical spine.  The patient did have epidural injections, but got no relief from the injections.  He did have other forms of nonoperative care but then continued to have discomfort and pain.  We therefore did have a discussion regarding going forward with a four-level anterior cervical decompression and fusion.  The patient fully understood the risks and limitations of the procedure as outlined in my preoperative  note.  OPERATIVE DETAILS:  On April 26, 2012, the patient was brought to the surgery and general endotracheal anesthesia was administered.  Patient was placed supine on a well-padded hospital bed.  All bony prominences were meticulously padded.  Foley catheter was placed.  Antibiotics were given.  A time-out procedure was performed.  I then made a left-sided oblique incision at the medial border of the sternocleidomastoid muscle. The platysma was sharply incised.  The plane between the sternocleidomastoid muscle laterally and the strap muscles medially was identified and explored.  The anterior cervical spine was readily noted. I then confirmed the appropriate operative level using a lateral fluoroscopic radiograph.  I then subperiosteally exposed the vertebral bodies of C3, C4, C5, C6, and C7 were subperiosteally exposed.  I then turned my attention towards the C6-7 interspace.  A self-retaining Shadow-Line retractor was placed overlying the C3, C6-7 interspace.  I then placed Caspar pins into the C6 and C7 vertebral bodies. Distraction was applied.  I then went forward with a thorough and complete diskectomy removing the entirety of the intervertebral disk. The posterior longitudinal ligament was identified and was violated using a nerve hook.  I then used #1 followed by #2 Kerrison to perform a thorough neuroforaminal decompression on both the right and the left sides.  The endplates were then  prepared and I placed a series of trials.  I did feel that a 5 mm trial will be the most appropriate fit. A 5 mm tightened interbody device was packed with DBX mix.  Autograft obtained for removing osteophytes anteriorly were also placed into the interbody spacer and the spacer was tamped into position in the usual press-fit fashion.  The traction was then discontinued.  I was very pleased with the final press-fit.  I then turned my attention towards the C5-6 interspace.  A diskectomy was  performed in the manner described previously.  I again confirmed a thorough neuroforaminal decompression on both right and the left sides.  Again, trials were placed and a 5 mm implant packed with allograft and autograft was again tamped into position in the manner described previously.  The same procedure was performed at the C4-5, and then the C3-4 level.  At this point, I did explore the wound for any undue bleeding, and there was none.  I then chose a 4 level Synthes Vectra plate.  This was a placed over the anterior cervical spine.  I then placed a series of 16 mm self-drilling, self-tapping screws, 2 in each vertebral body for a total of 10 screws. I was very pleased with the final appearance of the intraoperative AP and lateral fluoroscopic views.  Of note, AP and lateral fluoroscopy was used while placing the implants.  The wound was then again copiously irrigated and I explored for any undue bleeding, and there was none.  I then closed the platysma using 2-0 Vicryl.  The skin was then closed using 4-0 Monocryl.  Benzoin and Steri-Strips were applied, followed by sterile dressing.  All instrument counts were correct at the termination of the procedure.  Of note, Leatrice Jewels, [facility], was my assistant throughout the procedure and aided in essential retraction and suctioning required throughout the surgery.     Estill Bamberg, MD     MD/MEDQ  D:  04/26/2012  T:  04/27/2012  Job:  [id number]  cc:   Dr. Magnus Ivan

## 2012-04-27 NOTE — Progress Notes (Signed)
Orthopedic Tech Progress Note Patient Details:  Russell Peterson 11-Sep-1945 [id number] Patient issued [place] collar upon discharged for showers.   Note that previous order for hard cervical collar has already been completed. Ortho Devices Type of Ortho Device: [place] cervical collar Ortho Device/Splint Interventions: Ordered   [place] 04/27/2012, 10:37 AM

## 2012-05-01 ENCOUNTER — Encounter (HOSPITAL_COMMUNITY): Payer: Self-pay | Admitting: Orthopedic Surgery

## 2012-05-17 NOTE — Discharge Summary (Signed)
Patient ID: Russell Peterson MRN: [id number] DOB/AGE: 09/11/1945 66 y.o.  Admit date: 04/26/2012 Discharge date: 04/27/2012 Admission Diagnoses:  Cervical Radiluopathy Discharge Diagnoses:  Improved  Past Medical History  Diagnosis Date  . Arthritis   . Anxiety   . Hyperlipidemia   . Diabetes mellitus 2013    pre-diabetic    no meds  . Hypertension     dr Gordy Councilman    in [place]  [place]    Surgeries: Procedure(s): ANTERIOR CERVICAL DECOMPRESSION/DISCECTOMY FUSION 4 LEVELS on 04/26/2012   Discharged Condition: Improved  Hospital Course: Russell Peterson is an 66 y.o. male who was admitted 04/26/2012 for operative treatment of Cervical radiculopathy. Patient has severe unremitting pain that affects sleep, daily activities, and work/hobbies. After pre-op clearance the patient was taken to the operating room on 04/26/2012 and underwent  Procedure(s): ANTERIOR CERVICAL DECOMPRESSION/DISCECTOMY FUSION 4 LEVELS.    Patient was given perioperative antibiotics:  Anti-infectives     Start     Dose/Rate Route Frequency Ordered Stop   04/26/12 1800   ceFAZolin (ANCEF) IVPB 1 g/50 mL premix        1 g 100 mL/hr over 30 Minutes Intravenous Every 8 hours 04/26/12 1705 04/27/12 0232   04/25/12 1423   ceFAZolin (ANCEF) IVPB 2 g/50 mL premix        2 g 100 mL/hr over 30 Minutes Intravenous 60 min pre-op 04/25/12 1423 04/26/12 1248           Patient was given sequential compression devices, early ambulation, and chemoprophylaxis to prevent DVT.  Patient benefited maximally from hospital stay and there were no complications.     Recent laboratory studies: No results found for this basename: WBC:2,HGB:2,HCT:2,PLT:2,NA:2,K:2,CL:2,CO2:2,BUN:2,CREATININE:2,GLUCOSE:2,PT:2,INR:2,CALCIUM,2: in the last 72 hours   Discharge Medications:     Medication List     As of 05/17/2012  1:33 PM    STOP taking these medications         aspirin EC 81 MG tablet      diclofenac 50 MG EC tablet   Commonly known as: VOLTAREN      traMADol 50 MG tablet   Commonly known as: ULTRAM      TAKE these medications         amLODipine 5 MG tablet   Commonly known as: NORVASC   Take 5 mg by mouth daily.      gabapentin 400 MG capsule   Commonly known as: NEURONTIN   Take 800 mg by mouth 4 (four) times daily.      lisinopril-hydrochlorothiazide 20-25 MG per tablet   Commonly known as: PRINZIDE,ZESTORETIC   Take 1 tablet by mouth daily.      nortriptyline 50 MG capsule   Commonly known as: PAMELOR   Take 50 mg by mouth at bedtime.      vitamin E 400 UNIT capsule   Take 400 Units by mouth daily.      zolpidem 10 MG tablet   Commonly known as: AMBIEN   Take 10 mg by mouth at bedtime as needed. For sleep.        Diagnostic Studies: Dg Chest 2 View  04/20/2012  *RADIOLOGY REPORT*  Clinical Data: Preop for anterior cervical fusion  CHEST - 2 VIEW  Comparison: None.  Findings: The lungs are clear although slightly hyperaerated. Mediastinal contours appear stable.  The heart is within normal limits in size.  A left humeral head prosthesis is noted.  IMPRESSION: No active lung disease.  Slight hyperaeration.   Original Report  Authenticated By: Dwyane Dee, M.D.    Dg Cervical Spine 1 View  04/26/2012  *RADIOLOGY REPORT*  Clinical Data: C3-C7 ACDF.  DG CERVICAL SPINE - 1 VIEW,DG C-ARM 1-60 MIN  Comparison: None.  Findings: Single intraoperative lateral fluoroscopic spot film is submitted for interpretation showing an ACDF plate extending from C3 through the C7.  Discectomy with interbody spacer.  Endotracheal tube and nasogastric tube are present.  Surgical sponge in the anterior soft tissues of the neck.  Fluoroscopy time 6 seconds.  IMPRESSION: C3-C7 ACDF.   Original Report Authenticated By: Andreas Newport, M.D.    Dg C-arm 1-60 Min  04/26/2012  *RADIOLOGY REPORT*  Clinical Data: C3-C7 ACDF.  DG CERVICAL SPINE - 1 VIEW,DG C-ARM 1-60 MIN  Comparison: None.  Findings: Single  intraoperative lateral fluoroscopic spot film is submitted for interpretation showing an ACDF plate extending from C3 through the C7.  Discectomy with interbody spacer.  Endotracheal tube and nasogastric tube are present.  Surgical sponge in the anterior soft tissues of the neck.  Fluoroscopy time 6 seconds.  IMPRESSION: C3-C7 ACDF.   Original Report Authenticated By: Andreas Newport, M.D.     Disposition: 01-Home or Self Care    F/U in office in 2wks, pt sent home with discharge packet and written prescriptions   Signed: Georga Bora 05/17/2012, 1:33 PM

## 2012-11-07 ENCOUNTER — Inpatient Hospital Stay: Admit: 2012-11-07 | Payer: Self-pay | Admitting: Orthopedic Surgery

## 2012-11-07 SURGERY — ARTHROPLASTY, SHOULDER, TOTAL
Anesthesia: Choice | Laterality: Right

## 2013-04-30 ENCOUNTER — Encounter (HOSPITAL_COMMUNITY)
Admission: RE | Disposition: A | Discharge status: Home / Self Care | Payer: Self-pay | Source: Ambulatory Visit | Attending: Gastroenterology

## 2013-04-30 ENCOUNTER — Ambulatory Visit (HOSPITAL_COMMUNITY)
Admission: RE | Admit: 2013-04-30 | Discharge: 2013-04-30 | Disposition: A | Discharge status: Home / Self Care | Payer: Medicare Other | Source: Ambulatory Visit | Attending: Gastroenterology | Admitting: Gastroenterology

## 2013-04-30 DIAGNOSIS — K311 Adult hypertrophic pyloric stenosis: Secondary | ICD-10-CM | POA: Insufficient documentation

## 2013-04-30 DIAGNOSIS — R079 Chest pain, unspecified: Secondary | ICD-10-CM | POA: Insufficient documentation

## 2013-04-30 HISTORY — PX: ESOPHAGEAL MANOMETRY: SHX5429

## 2013-04-30 SURGERY — MANOMETRY, ESOPHAGUS
Anesthesia: Topical

## 2013-04-30 MED ORDER — LIDOCAINE VISCOUS 2 % MT SOLN
OROMUCOSAL | Status: AC
  Filled 2013-04-30: qty 15

## 2013-05-01 ENCOUNTER — Encounter (HOSPITAL_COMMUNITY): Payer: Self-pay | Admitting: Gastroenterology

## 2013-06-08 ENCOUNTER — Encounter (HOSPITAL_COMMUNITY): Payer: Self-pay | Admitting: Gastroenterology

## 2014-04-23 ENCOUNTER — Ambulatory Visit: Payer: Medicare Other | Admitting: Family Medicine

## 2014-04-29 ENCOUNTER — Ambulatory Visit: Payer: Medicare Other | Admitting: Family Medicine

## 2015-08-22 ENCOUNTER — Encounter: Payer: Medicare Other | Admitting: Physical Medicine & Rehabilitation

## 2018-09-04 ENCOUNTER — Other Ambulatory Visit: Payer: Self-pay | Admitting: Orthopedic Surgery

## 2018-09-04 DIAGNOSIS — M542 Cervicalgia: Secondary | ICD-10-CM

## 2018-09-08 ENCOUNTER — Ambulatory Visit
Admission: RE | Admit: 2018-09-08 | Discharge: 2018-09-08 | Disposition: A | Discharge status: Home / Self Care | Payer: Non-veteran care | Source: Ambulatory Visit | Attending: Orthopedic Surgery | Admitting: Orthopedic Surgery

## 2018-09-08 ENCOUNTER — Other Ambulatory Visit: Payer: Self-pay

## 2018-09-08 DIAGNOSIS — M542 Cervicalgia: Secondary | ICD-10-CM

## 2018-09-26 ENCOUNTER — Ambulatory Visit: Payer: Self-pay | Admitting: Orthopedic Surgery

## 2018-09-26 ENCOUNTER — Encounter (HOSPITAL_COMMUNITY): Payer: Self-pay

## 2018-09-26 NOTE — Pre-Procedure Instructions (Addendum)
NIKITAS SEEPERSAD  09/26/2018      [facility] - [place] - [address] [place] [address] Phone: [phone] Fax: [phone]    Your procedure is scheduled on Thurs., Oct 05, 2018 from 7:30AM-12:30AM  Report to [facility]  Entrance "A" at 5:30AM  Call this number if you have problems the morning of surgery:  [phone]   Remember:  Do not eat or drink after midnight on May 6th    Take these medicines the morning of surgery with A SIP OF WATER:  Atorvastatin (LIPITOR) Gabapentin (NEURONTIN) HYDROcodone-acetaminophen (NORCO)  7 days before surgery (09/28/18), stop taking all Aspirin (unless instructed by your doctor) and Aspirin Products, Vitamins, Fish oils, and Herbal medications. Also stop all NSAIDS i.e. Advil, Ibuprofen, Motrin, Aleve, Anaprox, Naproxen, BC, Goody Powders, and all Supplements.   How to Manage Your Diabetes Before and After Surgery  Why is it important to control my blood sugar before and after surgery? . Improving blood sugar levels before and after surgery helps healing and can limit problems. . A way of improving blood sugar control is eating a healthy diet by: o  Eating less sugar and carbohydrates o  Increasing activity/exercise o  Talking with your doctor about reaching your blood sugar goals . High blood sugars (greater than 180 mg/dL) can raise your risk of infections and slow your recovery, so you will need to focus on controlling your diabetes during the weeks before surgery. . Make sure that the doctor who takes care of your diabetes knows about your planned surgery including the date and location.  How do I manage my blood sugar before surgery? . Check your blood sugar at least 4 times a day, starting 2 days before surgery, to make sure that the level is not too high or low. o Check your blood sugar the morning of your surgery when you wake up and every 2 hours until you get to the Short  Stay unit. . If your blood sugar is less than 70 mg/dL, you will need to treat for low blood sugar: o Do not take insulin. o Treat a low blood sugar (less than 70 mg/dL) with  cup of clear juice (cranberry or apple), 4 glucose tablets, OR glucose gel. Recheck blood sugar in 15 minutes after treatment (to make sure it is greater than 70 mg/dL). If your blood sugar is not greater than 70 mg/dL on recheck, call [phone] o  for further instructions.                                . If your CBG is greater than 220 mg/dL, inform the staff upon arrival to Short Stay.  . If you are admitted to the hospital after surgery: o Your blood sugar will be checked by the staff and you will probably be given insulin after surgery (instead of oral diabetes medicines) to make sure you have good blood sugar levels. o The goal for blood sugar control after surgery is 80-180 mg/dL.  Reviewed and Endorsed by [facility] Patient Education Committee, August 2015  Special instructions:  [facility]- Preparing For Surgery  Before surgery, you can play an important role. Because skin is not sterile, your skin needs to be as free of germs as possible. You can reduce the number of germs on your skin by washing with CHG (chlorahexidine gluconate) Soap before surgery.  CHG is an antiseptic cleaner which kills germs and bonds with the skin to continue killing germs even after washing.    Please do not use if you have an allergy to CHG or antibacterial soaps. If your skin becomes reddened/irritated stop using the CHG.  Do not shave (including legs and underarms) for at least 48 hours prior to first CHG shower. It is OK to shave your face.  Please follow these instructions carefully.   1. Shower the NIGHT BEFORE SURGERY and the MORNING OF SURGERY with CHG.   2. If you chose to wash your hair, wash your hair first as usual with your normal shampoo.  3. After you shampoo, rinse your hair and body thoroughly to remove  the shampoo.  4. Use CHG as you would any other liquid soap. You can apply CHG directly to the skin and wash gently with a scrungie or a clean washcloth.   5. Apply the CHG Soap to your body ONLY FROM THE NECK DOWN.  Do not use on open wounds or open sores. Avoid contact with your eyes, ears, mouth and genitals (private parts). Wash Face and genitals (private parts)  with your normal soap.  6. Wash thoroughly, paying special attention to the area where your surgery will be performed.  7. Thoroughly rinse your body with warm water from the neck down.  8. DO NOT shower/wash with your normal soap after using and rinsing off the CHG Soap.  9. Pat yourself dry with a CLEAN TOWEL.  10. Wear CLEAN PAJAMAS to bed the night before surgery, wear comfortable clothes the morning of surgery  11. Place CLEAN SHEETS on your bed the night of your first shower and DO NOT SLEEP WITH PETS.   Day of Surgery:  Oral Hygiene is also important to reduce your risk of infection.  Remember - BRUSH YOUR TEETH THE MORNING OF SURGERY WITH YOUR REGULAR TOOTHPASTE   Do not wear jewelry.  Do not wear lotions, powders, colognes, or deodorant.  Do not shave 48 hours prior to surgery.  Men may shave face  Do not bring valuables to the hospital.  [facility] is not responsible for any belongings or valuables.  Contacts, dentures or bridgework may not be worn into surgery.  Leave your suitcase in the car.  After surgery it may be brought to your room.  For patients admitted to the hospital, discharge time will be determined by your treatment team.  Patients discharged the day of surgery will not be allowed to drive home.   Please wear clean clothes to the hospital/surgery center.    Please read over the following fact sheets that you were given. Pain Booklet, Coughing and Deep Breathing, MRSA Information and Surgical Site Infection Prevention

## 2018-09-27 ENCOUNTER — Encounter (HOSPITAL_COMMUNITY)
Admission: RE | Admit: 2018-09-27 | Discharge: 2018-09-27 | Disposition: A | Discharge status: Home / Self Care | Payer: No Typology Code available for payment source | Source: Ambulatory Visit | Attending: Orthopedic Surgery | Admitting: Orthopedic Surgery

## 2018-09-27 ENCOUNTER — Other Ambulatory Visit: Payer: Self-pay

## 2018-09-27 ENCOUNTER — Encounter (HOSPITAL_COMMUNITY): Payer: Self-pay

## 2018-09-27 DIAGNOSIS — Z01818 Encounter for other preprocedural examination: Secondary | ICD-10-CM | POA: Diagnosis present

## 2018-09-27 HISTORY — DX: Chondrocostal junction syndrome (tietze): M94.0

## 2018-09-27 HISTORY — DX: Prediabetes: R73.03

## 2018-09-27 LAB — URINALYSIS, ROUTINE W REFLEX MICROSCOPIC
Bilirubin Urine: NEGATIVE
Glucose, UA: NEGATIVE mg/dL
Hgb urine dipstick: NEGATIVE
Ketones, ur: NEGATIVE mg/dL
Leukocytes,Ua: NEGATIVE
Nitrite: NEGATIVE
Protein, ur: NEGATIVE mg/dL
Specific Gravity, Urine: 1.006 (ref 1.005–1.030)
pH: 7 (ref 5.0–8.0)

## 2018-09-27 LAB — CBC
HCT: 46.5 % (ref 39.0–52.0)
Hemoglobin: 15.7 g/dL (ref 13.0–17.0)
MCH: 29.6 pg (ref 26.0–34.0)
MCHC: 33.8 g/dL (ref 30.0–36.0)
MCV: 87.6 fL (ref 80.0–100.0)
Platelets: 261 10*3/uL (ref 150–400)
RBC: 5.31 MIL/uL (ref 4.22–5.81)
RDW: 11.9 % (ref 11.5–15.5)
WBC: 7.9 10*3/uL (ref 4.0–10.5)
nRBC: 0 % (ref 0.0–0.2)

## 2018-09-27 LAB — HEMOGLOBIN A1C
Hgb A1c MFr Bld: 5.5 % (ref 4.8–5.6)
Mean Plasma Glucose: 111.15 mg/dL

## 2018-09-27 LAB — BASIC METABOLIC PANEL
Anion gap: 13 (ref 5–15)
BUN: 21 mg/dL (ref 8–23)
CO2: 24 mmol/L (ref 22–32)
Calcium: 9.9 mg/dL (ref 8.9–10.3)
Chloride: 100 mmol/L (ref 98–111)
Creatinine, Ser: 1.09 mg/dL (ref 0.61–1.24)
GFR calc Af Amer: >60 mL/min (ref >60)
GFR calc non Af Amer: >60 mL/min (ref >60)
Glucose, Bld: 101 mg/dL — ABNORMAL HIGH (ref 70–99)
Potassium: 4.2 mmol/L (ref 3.5–5.1)
Sodium: 137 mmol/L (ref 135–145)

## 2018-09-27 LAB — PROTIME-INR
INR: 0.9 (ref 0.8–1.2)
Prothrombin Time: 12.5 seconds (ref 11.4–15.2)

## 2018-09-27 LAB — SURGICAL PCR SCREEN
MRSA, PCR: NEGATIVE
Staphylococcus aureus: POSITIVE — AB

## 2018-09-27 NOTE — Progress Notes (Signed)
PCP - [facility]  Cardiologist - Denies  Chest x-ray - 09/27/18  EKG - 01/17/18.  Records requested  Stress Test - Denies  ECHO - Denies  Cardiac Cath - Denies  AICD-Denies PM-Denies LOOP-Denies  Sleep Study - Y CPAP - yes. Pt advised to bring CPAP with him on DOS  LABS-CBC, BMP, PT, UA, A1C, T/S  ASA-Last dose 04/29  HA1C- 09/27/18 Pt. Prediabetic. Fasting Blood Sugar -  Checks Blood Sugar _0____ times a day  Anesthesia-Y. Consult orders  Pt denies having chest pain, sob, or fever at this time. All instructions explained to the pt, with a verbal understanding of the material. Pt agrees to go over the instructions while at home for a better understanding. The opportunity to ask questions was provided. ^

## 2018-09-27 NOTE — Progress Notes (Signed)

## 2018-09-28 LAB — TYPE AND SCREEN
ABO/RH(D): B POS
Antibody Screen: NEGATIVE

## 2018-09-28 NOTE — Progress Notes (Signed)
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  History includes never smoker, HTN, HLD, pre-diabetes, costochondritis, anxiety, C3-4/4-5/5-6/6-7 ACDF 04/26/12, partial colon resection (due to diverticulitis) 2010, L2-3/3-4/4-5/L5-S1 laminectomy 01/31/18.  Last ASA 09/27/18. He denied chest pain, SOB, fever at PAT RN visit. If no acute changes then I anticipate that he can proceed as planned.   VS: BP 104/67   Pulse 81   Temp 36.7 C (Oral)   Resp 16   Ht 5\' 9"  (1.753 m)   Wt 85.1 kg   SpO2 96%   BMI 27.71 kg/m    PROVIDERS: PCP is with [facility]   LABS: Labs reviewed: Acceptable for surgery. (all labs ordered are listed, but only abnormal results are displayed)  Labs Reviewed  SURGICAL PCR SCREEN - Abnormal; Notable for the following components:      Result Value   Staphylococcus aureus POSITIVE (*)    All other components within normal limits  BASIC METABOLIC PANEL - Abnormal; Notable for the following components:   Glucose, Bld 101 (*)    All other components within normal limits  URINALYSIS, ROUTINE W REFLEX MICROSCOPIC - Abnormal; Notable for the following components:   Color, Urine STRAW (*)    All other components within normal limits  CBC  PROTIME-INR  HEMOGLOBIN A1C  TYPE AND SCREEN    IMAGES: CXR 09/27/18: IMPRESSION: No active cardiopulmonary disease.  CT C-spine 09/08/18: IMPRESSION: 1. ACDF C3 through C7. Solid fusion with exception of C6-7 which show pseudarthrosis. No acute bony abnormality 2. Multilevel foraminal stenosis due to spurring. There is moderate to severe left foraminal stenosis C4-5. Moderate foraminal  stenosis bilaterally C5-6. Moderate left foraminal stenosis at C6-7. 3. Degenerative anterolisthesis C7-T1 with disc and facet degeneration causing moderate foraminal stenosis bilaterally.   EKG: 01/17/18 [facility]): Tracing requested, but per Care Everywhere: Result Narrative: Diagnosis Class Normal Acquisition Device MAC55 Ventricular Rate 65 Atrial Rate 65 P-R Interval 182 QRS Duration 94 Q-T Interval 390 QTC Calculation(Bazett) 405 Calculated P Axis 27 Calculated R Axis 22 Calculated T Axis 40 Diagnosis Normal sinus rhythm Normal ECG No previous ECGs available Wahid, Asif ([id number]) on 01/17/2018 5:02:12 PM certifies that he/she has reviewed the ECG tracing and confirms the independent interpretation is correct.   CV: Stress echo 11/22/11 ([facility]): Interpretation: Normal Stress Test.   Note: GRADE 1 DIASTOLIC DYSFUNCTION   TRIVIAL AR, MR, PR AND TR   Past Medical History:  Diagnosis Date  . Anxiety   . Arthritis   . Costochondritis   . Hyperlipidemia   . Hypertension    dr Gordy Councilman    in [place]  [place]  . Pre-diabetes     Past Surgical History:  Procedure Laterality Date  . ANTERIOR CERVICAL DECOMPRESSION/DISCECTOMY FUSION 4 LEVELS  04/26/2012   Procedure: ANTERIOR CERVICAL DECOMPRESSION/DISCECTOMY FUSION 4 LEVELS;  Surgeon: Emilee Hero, MD;  Location: [facility] OR;  Service: Orthopedics;  Laterality: Bilateral;  Anterior cervical decompression fusion, cervical 3-4, cervical 4-5, cervical 5-6, cervical 6-7 with instrumentation and allograft.  . CARPAL TUNNEL RELEASE    . COLON SURGERY  2010   partial colon  resection secondary diverticulitis.   Marland Kitchen. ESOPHAGEAL MANOMETRY N/A 04/30/2013   Procedure: ESOPHAGEAL MANOMETRY (EM);  Surgeon: Theda BelfastPatrick D Hung, MD;  Location: [facility];  Service: Endoscopy;  Laterality: N/A;  . JOINT REPLACEMENT     L shoulder, R knee  . TONSILLECTOMY      MEDICATIONS: . aspirin EC 81 MG tablet  . atorvastatin (LIPITOR) 20 MG tablet   . baclofen (LIORESAL) 10 MG tablet  . Cholecalciferol (VITAMIN D) 50 MCG (2000 UT) tablet  . diclofenac sodium (VOLTAREN) 1 % GEL  . gabapentin (NEURONTIN) 400 MG capsule  . hydrochlorothiazide (HYDRODIURIL) 25 MG tablet  . HYDROcodone-acetaminophen (NORCO) 10-325 MG tablet  . lisinopril (ZESTRIL) 20 MG tablet  . nortriptyline (PAMELOR) 50 MG capsule  . oxybutynin (DITROPAN-XL) 5 MG 24 hr tablet  . traMADol (ULTRAM) 50 MG tablet  . vitamin C (ASCORBIC ACID) 500 MG tablet  . vitamin E 400 UNIT capsule  . zolpidem (AMBIEN) 10 MG tablet   No current facility-administered medications for this encounter.     Shonna ChockAllison Khrystian Schauf, PA-C Surgical Short Stay/Anesthesiology [facility] Phone [phone] [facility] Phone [phone] 09/28/2018 6:03 PM

## 2018-09-28 NOTE — Anesthesia Preprocedure Evaluation (Addendum)
Anesthesia Evaluation  Patient identified by MRN, date of birth, ID band Patient awake    Reviewed: Allergy & Precautions, H&P , NPO status , Patient's Chart, lab work & pertinent test results  Airway Mallampati: II   Neck ROM: full    Dental   Pulmonary neg pulmonary ROS,    breath sounds clear to auscultation       Cardiovascular hypertension,  Rhythm:regular Rate:Normal     Neuro/Psych PSYCHIATRIC DISORDERS Anxiety    GI/Hepatic   Endo/Other    Renal/GU      Musculoskeletal  (+) Arthritis ,   Abdominal   Peds  Hematology   Anesthesia Other Findings   Reproductive/Obstetrics                            Anesthesia Physical Anesthesia Plan  ASA: II  Anesthesia Plan: General   Post-op Pain Management:    Induction: Intravenous  PONV Risk Score and Plan: 2 and Ondansetron, Dexamethasone, Treatment may vary due to age or medical condition and Midazolam  Airway Management Planned: Oral ETT  Additional Equipment:   Intra-op Plan:   Post-operative Plan: Extubation in OR  Informed Consent: I have reviewed the patients History and Physical, chart, labs and discussed the procedure including the risks, benefits and alternatives for the proposed anesthesia with the patient or authorized representative who has indicated his/her understanding and acceptance.       Plan Discussed with: Anesthesiologist, CRNA and Surgeon  Anesthesia Plan Comments: (PAT note written 09/28/2018 by Shonna Chock, PA-C. )       Anesthesia Quick Evaluation

## 2018-10-04 MED ORDER — TRANEXAMIC ACID-NACL 1000-0.7 MG/100ML-% IV SOLN
1000.0000 mg | INTRAVENOUS | Status: AC
  Administered 2018-10-05: 1000 mg via INTRAVENOUS
  Filled 2018-10-04: qty 100

## 2018-10-04 MED ORDER — VANCOMYCIN HCL IN DEXTROSE 1-5 GM/200ML-% IV SOLN
1000.0000 mg | INTRAVENOUS | Status: AC
  Administered 2018-10-05: 1000 mg via INTRAVENOUS
  Filled 2018-10-04: qty 200

## 2018-10-05 ENCOUNTER — Encounter (HOSPITAL_COMMUNITY): Payer: Self-pay

## 2018-10-05 ENCOUNTER — Inpatient Hospital Stay (HOSPITAL_COMMUNITY): Payer: No Typology Code available for payment source

## 2018-10-05 ENCOUNTER — Inpatient Hospital Stay (HOSPITAL_COMMUNITY): Payer: No Typology Code available for payment source | Admitting: Certified Registered Nurse Anesthetist

## 2018-10-05 ENCOUNTER — Inpatient Hospital Stay (HOSPITAL_COMMUNITY)
Admission: RE | Admit: 2018-10-05 | Discharge: 2018-10-06 | DRG: 460 | Disposition: A | Discharge status: Home / Self Care | Payer: No Typology Code available for payment source | Attending: Orthopedic Surgery | Admitting: Orthopedic Surgery

## 2018-10-05 ENCOUNTER — Inpatient Hospital Stay (HOSPITAL_COMMUNITY): Payer: No Typology Code available for payment source | Admitting: Physician Assistant

## 2018-10-05 ENCOUNTER — Other Ambulatory Visit: Payer: Self-pay

## 2018-10-05 ENCOUNTER — Encounter (HOSPITAL_COMMUNITY)
Admission: RE | Disposition: A | Discharge status: Home / Self Care | Payer: Self-pay | Source: Home / Self Care | Attending: Orthopedic Surgery

## 2018-10-05 DIAGNOSIS — Z79899 Other long term (current) drug therapy: Secondary | ICD-10-CM

## 2018-10-05 DIAGNOSIS — Z96619 Presence of unspecified artificial shoulder joint: Secondary | ICD-10-CM | POA: Diagnosis present

## 2018-10-05 DIAGNOSIS — M542 Cervicalgia: Secondary | ICD-10-CM | POA: Diagnosis present

## 2018-10-05 DIAGNOSIS — M4313 Spondylolisthesis, cervicothoracic region: Secondary | ICD-10-CM | POA: Diagnosis present

## 2018-10-05 DIAGNOSIS — E785 Hyperlipidemia, unspecified: Secondary | ICD-10-CM | POA: Diagnosis present

## 2018-10-05 DIAGNOSIS — G4730 Sleep apnea, unspecified: Secondary | ICD-10-CM | POA: Diagnosis present

## 2018-10-05 DIAGNOSIS — Z7982 Long term (current) use of aspirin: Secondary | ICD-10-CM

## 2018-10-05 DIAGNOSIS — R7303 Prediabetes: Secondary | ICD-10-CM | POA: Diagnosis present

## 2018-10-05 DIAGNOSIS — I10 Essential (primary) hypertension: Secondary | ICD-10-CM | POA: Diagnosis present

## 2018-10-05 DIAGNOSIS — M4802 Spinal stenosis, cervical region: Secondary | ICD-10-CM | POA: Diagnosis present

## 2018-10-05 DIAGNOSIS — Z978 Presence of other specified devices: Secondary | ICD-10-CM

## 2018-10-05 DIAGNOSIS — F419 Anxiety disorder, unspecified: Secondary | ICD-10-CM | POA: Diagnosis present

## 2018-10-05 DIAGNOSIS — Z791 Long term (current) use of non-steroidal anti-inflammatories (NSAID): Secondary | ICD-10-CM

## 2018-10-05 DIAGNOSIS — M1990 Unspecified osteoarthritis, unspecified site: Secondary | ICD-10-CM | POA: Diagnosis present

## 2018-10-05 DIAGNOSIS — Z419 Encounter for procedure for purposes other than remedying health state, unspecified: Secondary | ICD-10-CM

## 2018-10-05 DIAGNOSIS — M960 Pseudarthrosis after fusion or arthrodesis: Principal | ICD-10-CM | POA: Diagnosis present

## 2018-10-05 DIAGNOSIS — Z79891 Long term (current) use of opiate analgesic: Secondary | ICD-10-CM

## 2018-10-05 DIAGNOSIS — Y838 Other surgical procedures as the cause of abnormal reaction of the patient, or of later complication, without mention of misadventure at the time of the procedure: Secondary | ICD-10-CM | POA: Diagnosis present

## 2018-10-05 HISTORY — PX: POSTERIOR CERVICAL FUSION/FORAMINOTOMY: SHX5038

## 2018-10-05 LAB — GLUCOSE, CAPILLARY
Glucose-Capillary: 91 mg/dL (ref 70–99)
Glucose-Capillary: 152 mg/dL — ABNORMAL HIGH (ref 70–99)

## 2018-10-05 SURGERY — POSTERIOR CERVICAL FUSION/FORAMINOTOMY LEVEL 3
Anesthesia: General | Site: Spine Cervical

## 2018-10-05 MED ORDER — MENTHOL 3 MG MT LOZG: 1.0000 | LOZENGE | OROMUCOSAL | Status: DC | PRN

## 2018-10-05 MED ORDER — OXYCODONE HCL 5 MG/5ML PO SOLN: 5.0000 mg | Freq: Once | ORAL | Status: AC | PRN

## 2018-10-05 MED ORDER — HYDROCHLOROTHIAZIDE 25 MG PO TABS
25.0000 mg | ORAL_TABLET | Freq: Every day | ORAL | Status: DC
  Administered 2018-10-05: 25 mg via ORAL
  Filled 2018-10-05: qty 1

## 2018-10-05 MED ORDER — ALBUMIN HUMAN 5 % IV SOLN
INTRAVENOUS | Status: DC | PRN
  Administered 2018-10-05: 11:00:00 via INTRAVENOUS

## 2018-10-05 MED ORDER — BUPIVACAINE-EPINEPHRINE (PF) 0.25% -1:200000 IJ SOLN
INTRAMUSCULAR | Status: AC
  Filled 2018-10-05: qty 30

## 2018-10-05 MED ORDER — PROPOFOL 10 MG/ML IV BOLUS
INTRAVENOUS | Status: DC | PRN
  Administered 2018-10-05: 150 mg via INTRAVENOUS

## 2018-10-05 MED ORDER — OXYCODONE HCL 5 MG PO TABS
5.0000 mg | ORAL_TABLET | Freq: Once | ORAL | Status: AC | PRN
  Administered 2018-10-05: 5 mg via ORAL

## 2018-10-05 MED ORDER — FENTANYL CITRATE (PF) 250 MCG/5ML IJ SOLN
INTRAMUSCULAR | Status: AC
  Filled 2018-10-05: qty 5

## 2018-10-05 MED ORDER — ROCURONIUM BROMIDE 50 MG/5ML IV SOSY
PREFILLED_SYRINGE | INTRAVENOUS | Status: DC | PRN
  Administered 2018-10-05: 50 mg via INTRAVENOUS

## 2018-10-05 MED ORDER — SUCCINYLCHOLINE CHLORIDE 200 MG/10ML IV SOSY
PREFILLED_SYRINGE | INTRAVENOUS | Status: AC
  Filled 2018-10-05: qty 10

## 2018-10-05 MED ORDER — OXYBUTYNIN CHLORIDE ER 5 MG PO TB24
5.0000 mg | ORAL_TABLET | Freq: Every day | ORAL | Status: DC
  Administered 2018-10-05: 5 mg via ORAL
  Filled 2018-10-05: qty 1

## 2018-10-05 MED ORDER — MIDAZOLAM HCL 2 MG/2ML IJ SOLN
INTRAMUSCULAR | Status: DC | PRN
  Administered 2018-10-05: 2 mg via INTRAVENOUS

## 2018-10-05 MED ORDER — PROPOFOL 10 MG/ML IV BOLUS
INTRAVENOUS | Status: AC
  Filled 2018-10-05: qty 40

## 2018-10-05 MED ORDER — LACTATED RINGERS IV SOLN: INTRAVENOUS | Status: DC

## 2018-10-05 MED ORDER — SUGAMMADEX SODIUM 200 MG/2ML IV SOLN
INTRAVENOUS | Status: DC | PRN
  Administered 2018-10-05: 172.4 mg via INTRAVENOUS

## 2018-10-05 MED ORDER — THROMBIN 20000 UNITS EX KIT
PACK | CUTANEOUS | Status: AC
  Filled 2018-10-05: qty 1

## 2018-10-05 MED ORDER — PHENOL 1.4 % MT LIQD: 1.0000 | OROMUCOSAL | Status: DC | PRN

## 2018-10-05 MED ORDER — BUPIVACAINE-EPINEPHRINE 0.25% -1:200000 IJ SOLN
INTRAMUSCULAR | Status: DC | PRN
  Administered 2018-10-05: 30 mL

## 2018-10-05 MED ORDER — ROCURONIUM BROMIDE 10 MG/ML (PF) SYRINGE
PREFILLED_SYRINGE | INTRAVENOUS | Status: AC
  Filled 2018-10-05: qty 10

## 2018-10-05 MED ORDER — FENTANYL CITRATE (PF) 100 MCG/2ML IJ SOLN
INTRAMUSCULAR | Status: AC
  Filled 2018-10-05: qty 2

## 2018-10-05 MED ORDER — DEXAMETHASONE SODIUM PHOSPHATE 10 MG/ML IJ SOLN
INTRAMUSCULAR | Status: AC
  Filled 2018-10-05: qty 1

## 2018-10-05 MED ORDER — ONDANSETRON HCL 4 MG PO TABS: 4.0000 mg | ORAL_TABLET | Freq: Three times a day (TID) | ORAL | 0 refills | Status: AC | PRN

## 2018-10-05 MED ORDER — METHOCARBAMOL 500 MG PO TABS
ORAL_TABLET | ORAL | Status: AC
  Filled 2018-10-05: qty 1

## 2018-10-05 MED ORDER — EPHEDRINE SULFATE 50 MG/ML IJ SOLN
INTRAMUSCULAR | Status: DC | PRN
  Administered 2018-10-05: 10 mg via INTRAVENOUS
  Administered 2018-10-05: 15 mg via INTRAVENOUS
  Administered 2018-10-05 (×2): 10 mg via INTRAVENOUS

## 2018-10-05 MED ORDER — BUPIVACAINE LIPOSOME 1.3 % IJ SUSP
20.0000 mL | INTRAMUSCULAR | Status: AC
  Administered 2018-10-05: 11:00:00 20 mL
  Filled 2018-10-05: qty 20

## 2018-10-05 MED ORDER — PHENYLEPHRINE HCL (PRESSORS) 10 MG/ML IV SOLN
INTRAVENOUS | Status: DC | PRN
  Administered 2018-10-05: 100 ug via INTRAVENOUS
  Administered 2018-10-05: 120 ug via INTRAVENOUS
  Administered 2018-10-05: 80 ug via INTRAVENOUS
  Administered 2018-10-05: 120 ug via INTRAVENOUS
  Administered 2018-10-05: 80 ug via INTRAVENOUS
  Administered 2018-10-05 (×2): 120 ug via INTRAVENOUS

## 2018-10-05 MED ORDER — SODIUM CHLORIDE 0.9 % IV SOLN: 250.0000 mL | INTRAVENOUS | Status: DC

## 2018-10-05 MED ORDER — GABAPENTIN 300 MG PO CAPS
300.0000 mg | ORAL_CAPSULE | Freq: Three times a day (TID) | ORAL | Status: DC
  Administered 2018-10-05 (×2): 300 mg via ORAL
  Filled 2018-10-05 (×2): qty 1

## 2018-10-05 MED ORDER — LISINOPRIL 20 MG PO TABS
20.0000 mg | ORAL_TABLET | Freq: Every day | ORAL | Status: DC
  Administered 2018-10-05: 20 mg via ORAL
  Filled 2018-10-05: qty 1

## 2018-10-05 MED ORDER — ACETAMINOPHEN 325 MG PO TABS
650.0000 mg | ORAL_TABLET | ORAL | Status: DC | PRN
  Administered 2018-10-05 – 2018-10-06 (×3): 650 mg via ORAL
  Filled 2018-10-05 (×3): qty 2

## 2018-10-05 MED ORDER — METHOCARBAMOL 500 MG PO TABS: 500.0000 mg | ORAL_TABLET | Freq: Three times a day (TID) | ORAL | 0 refills | Status: AC | PRN

## 2018-10-05 MED ORDER — 0.9 % SODIUM CHLORIDE (POUR BTL) OPTIME
TOPICAL | Status: DC | PRN
  Administered 2018-10-05 (×2): 1000 mL

## 2018-10-05 MED ORDER — OXYCODONE HCL 5 MG PO TABS
10.0000 mg | ORAL_TABLET | ORAL | Status: DC | PRN
  Administered 2018-10-05 – 2018-10-06 (×4): 10 mg via ORAL
  Filled 2018-10-05 (×4): qty 2

## 2018-10-05 MED ORDER — OXYCODONE HCL 5 MG PO TABS: 5.0000 mg | ORAL_TABLET | ORAL | Status: DC | PRN

## 2018-10-05 MED ORDER — OXYCODONE-ACETAMINOPHEN 10-325 MG PO TABS: 1.0000 | ORAL_TABLET | Freq: Four times a day (QID) | ORAL | 0 refills | Status: AC | PRN

## 2018-10-05 MED ORDER — ACETAMINOPHEN 650 MG RE SUPP: 650.0000 mg | RECTAL | Status: DC | PRN

## 2018-10-05 MED ORDER — ONDANSETRON HCL 4 MG PO TABS: 4.0000 mg | ORAL_TABLET | Freq: Four times a day (QID) | ORAL | Status: DC | PRN

## 2018-10-05 MED ORDER — FENTANYL CITRATE (PF) 250 MCG/5ML IJ SOLN
INTRAMUSCULAR | Status: DC | PRN
  Administered 2018-10-05 (×2): 50 ug via INTRAVENOUS
  Administered 2018-10-05: 100 ug via INTRAVENOUS
  Administered 2018-10-05: 50 ug via INTRAVENOUS
  Administered 2018-10-05 (×2): 25 ug via INTRAVENOUS

## 2018-10-05 MED ORDER — LACTATED RINGERS IV SOLN
INTRAVENOUS | Status: DC
  Administered 2018-10-05: 06:00:00 via INTRAVENOUS

## 2018-10-05 MED ORDER — FENTANYL CITRATE (PF) 100 MCG/2ML IJ SOLN
25.0000 ug | INTRAMUSCULAR | Status: DC | PRN
  Administered 2018-10-05: 25 ug via INTRAVENOUS
  Administered 2018-10-05: 50 ug via INTRAVENOUS

## 2018-10-05 MED ORDER — LIDOCAINE 2% (20 MG/ML) 5 ML SYRINGE
INTRAMUSCULAR | Status: AC
  Filled 2018-10-05: qty 5

## 2018-10-05 MED ORDER — DEXAMETHASONE SODIUM PHOSPHATE 10 MG/ML IJ SOLN
INTRAMUSCULAR | Status: DC | PRN
  Administered 2018-10-05: 5 mg via INTRAVENOUS

## 2018-10-05 MED ORDER — SODIUM CHLORIDE 0.9 % IV SOLN
INTRAVENOUS | Status: DC | PRN
  Administered 2018-10-05: 25 ug/min via INTRAVENOUS

## 2018-10-05 MED ORDER — METHOCARBAMOL 1000 MG/10ML IJ SOLN
500.0000 mg | Freq: Four times a day (QID) | INTRAVENOUS | Status: DC | PRN
  Filled 2018-10-05: qty 5

## 2018-10-05 MED ORDER — MIDAZOLAM HCL 2 MG/2ML IJ SOLN
INTRAMUSCULAR | Status: AC
  Filled 2018-10-05: qty 2

## 2018-10-05 MED ORDER — SODIUM CHLORIDE 0.9% FLUSH
3.0000 mL | Freq: Two times a day (BID) | INTRAVENOUS | Status: DC
  Administered 2018-10-05: 3 mL via INTRAVENOUS

## 2018-10-05 MED ORDER — ONDANSETRON HCL 4 MG/2ML IJ SOLN: 4.0000 mg | Freq: Four times a day (QID) | INTRAMUSCULAR | Status: DC | PRN

## 2018-10-05 MED ORDER — ONDANSETRON HCL 4 MG/2ML IJ SOLN
INTRAMUSCULAR | Status: DC | PRN
  Administered 2018-10-05: 4 mg via INTRAVENOUS

## 2018-10-05 MED ORDER — THROMBIN 20000 UNITS EX SOLR
CUTANEOUS | Status: DC | PRN
  Administered 2018-10-05: 20000 [IU] via TOPICAL

## 2018-10-05 MED ORDER — METHOCARBAMOL 500 MG PO TABS
500.0000 mg | ORAL_TABLET | Freq: Four times a day (QID) | ORAL | Status: DC | PRN
  Administered 2018-10-05 – 2018-10-06 (×3): 500 mg via ORAL
  Filled 2018-10-05 (×2): qty 1

## 2018-10-05 MED ORDER — CEFAZOLIN SODIUM-DEXTROSE 1-4 GM/50ML-% IV SOLN
1.0000 g | Freq: Three times a day (TID) | INTRAVENOUS | Status: AC
  Administered 2018-10-05 (×2): 1 g via INTRAVENOUS
  Filled 2018-10-05 (×2): qty 50

## 2018-10-05 MED ORDER — SODIUM CHLORIDE 0.9% FLUSH: 3.0000 mL | INTRAVENOUS | Status: DC | PRN

## 2018-10-05 MED ORDER — ONDANSETRON HCL 4 MG/2ML IJ SOLN
INTRAMUSCULAR | Status: AC
  Filled 2018-10-05: qty 2

## 2018-10-05 MED ORDER — NORTRIPTYLINE HCL 25 MG PO CAPS
50.0000 mg | ORAL_CAPSULE | Freq: Every day | ORAL | Status: DC
  Administered 2018-10-05: 50 mg via ORAL
  Filled 2018-10-05: qty 2

## 2018-10-05 MED ORDER — LIDOCAINE 2% (20 MG/ML) 5 ML SYRINGE
INTRAMUSCULAR | Status: DC | PRN
  Administered 2018-10-05: 80 mg via INTRAVENOUS

## 2018-10-05 MED ORDER — HEMOSTATIC AGENTS (NO CHARGE) OPTIME
TOPICAL | Status: DC | PRN
  Administered 2018-10-05 (×3): 1 via TOPICAL

## 2018-10-05 MED ORDER — SUCCINYLCHOLINE CHLORIDE 200 MG/10ML IV SOSY
PREFILLED_SYRINGE | INTRAVENOUS | Status: DC | PRN
  Administered 2018-10-05: 100 mg via INTRAVENOUS

## 2018-10-05 MED ORDER — OXYCODONE HCL 5 MG PO TABS
ORAL_TABLET | ORAL | Status: AC
  Filled 2018-10-05: qty 1

## 2018-10-05 SURGICAL SUPPLY — 91 items
BIT DRILL MOUNTAINEER FIX 14 (BIT) ×1
BIT DRILL MOUNTAINEER FIX 14MM (BIT) ×1 IMPLANT
BLADE CLIPPER SURG (BLADE) IMPLANT
BONE VIVIGEN FORMABLE 10CC (Bone Implant) ×3 IMPLANT
BUR MATCHSTICK NEURO 3.0 LAGG (BURR) ×3 IMPLANT
CLOSURE WOUND 1/2 X4 (GAUZE/BANDAGES/DRESSINGS)
CLOSURE WOUND 1/4X4 (GAUZE/BANDAGES/DRESSINGS) ×1
CONN CROSSLNK R2R 26-32 (Connector) ×3 IMPLANT
CONNECTOR CROSSLNK R2R 26-32 (Connector) ×1 IMPLANT
CORDS BIPOLAR (ELECTRODE) ×3 IMPLANT
COVER MAYO STAND STRL (DRAPES) ×6 IMPLANT
COVER SURGICAL LIGHT HANDLE (MISCELLANEOUS) ×3 IMPLANT
COVER WAND RF STERILE (DRAPES) IMPLANT
DERMABOND ADVANCED (GAUZE/BANDAGES/DRESSINGS) ×2
DERMABOND ADVANCED .7 DNX12 (GAUZE/BANDAGES/DRESSINGS) ×1 IMPLANT
DRAPE C-ARM 42X72 X-RAY (DRAPES) ×6 IMPLANT
DRAPE POUCH INSTRU U-SHP 10X18 (DRAPES) ×3 IMPLANT
DRAPE SURG 17X23 STRL (DRAPES) ×6 IMPLANT
DRAPE U-SHAPE 47X51 STRL (DRAPES) ×3 IMPLANT
DRILL BIT MOUNTAINEER FIX 14MM (BIT) ×2
DRSG ADAPTIC 3X8 NADH LF (GAUZE/BANDAGES/DRESSINGS) ×3 IMPLANT
DRSG MEPILEX BORDER 4X8 (GAUZE/BANDAGES/DRESSINGS) ×3 IMPLANT
DRSG VAC ATS MED SENSATRAC (GAUZE/BANDAGES/DRESSINGS) ×3 IMPLANT
DURAPREP 26ML APPLICATOR (WOUND CARE) ×3 IMPLANT
ELECT BLADE 4.0 EZ CLEAN MEGAD (MISCELLANEOUS) ×3
ELECT BLADE 6.5 EXT (BLADE) ×3 IMPLANT
ELECT CAUTERY BLADE 6.4 (BLADE) ×3 IMPLANT
ELECT PENCIL ROCKER SW 15FT (MISCELLANEOUS) ×3 IMPLANT
ELECT REM PT RETURN 9FT ADLT (ELECTROSURGICAL) ×3
ELECTRODE BLDE 4.0 EZ CLN MEGD (MISCELLANEOUS) ×1 IMPLANT
ELECTRODE REM PT RTRN 9FT ADLT (ELECTROSURGICAL) ×1 IMPLANT
EVACUATOR 1/8 PVC DRAIN (DRAIN) IMPLANT
GLOVE BIO SURGEON STRL SZ 6.5 (GLOVE) ×2 IMPLANT
GLOVE BIO SURGEONS STRL SZ 6.5 (GLOVE) ×1
GLOVE BIOGEL PI IND STRL 6.5 (GLOVE) ×1 IMPLANT
GLOVE BIOGEL PI IND STRL 8.5 (GLOVE) ×1 IMPLANT
GLOVE BIOGEL PI INDICATOR 6.5 (GLOVE) ×2
GLOVE BIOGEL PI INDICATOR 8.5 (GLOVE) ×2
GLOVE SS BIOGEL STRL SZ 8.5 (GLOVE) ×1 IMPLANT
GLOVE SUPERSENSE BIOGEL SZ 8.5 (GLOVE) ×2
GOWN STRL REUS W/ TWL LRG LVL3 (GOWN DISPOSABLE) ×3 IMPLANT
GOWN STRL REUS W/TWL 2XL LVL3 (GOWN DISPOSABLE) ×6 IMPLANT
GOWN STRL REUS W/TWL LRG LVL3 (GOWN DISPOSABLE) ×6
IV CATH 14GX2 1/4 (CATHETERS) IMPLANT
KIT BASIN OR (CUSTOM PROCEDURE TRAY) ×3 IMPLANT
KIT DRSG PREVENA PLUS 7DAY 125 (MISCELLANEOUS) ×3 IMPLANT
KIT POSITION SURG JACKSON T1 (MISCELLANEOUS) ×3 IMPLANT
KIT TURNOVER KIT B (KITS) ×3 IMPLANT
NEEDLE 22X1 1/2 (OR ONLY) (NEEDLE) ×3 IMPLANT
NS IRRIG 1000ML POUR BTL (IV SOLUTION) ×6 IMPLANT
NUT LOCKING SPINAL (Nut) ×6 IMPLANT
PACK LAMINECTOMY ORTHO (CUSTOM PROCEDURE TRAY) ×3 IMPLANT
PACK UNIVERSAL I (CUSTOM PROCEDURE TRAY) ×3 IMPLANT
PAD ARMBOARD 7.5X6 YLW CONV (MISCELLANEOUS) ×6 IMPLANT
PATTIES SURGICAL .5 X.5 (GAUZE/BANDAGES/DRESSINGS) IMPLANT
PATTIES SURGICAL .5 X1 (DISPOSABLE) ×3 IMPLANT
ROD LORD TI 4.0X080 (Rod) ×6 IMPLANT
SCREW 4.0 PLY 3.5X14 (Screw) ×15 IMPLANT
SCREW 4.0 PLY 3.5X16 (Screw) ×3 IMPLANT
SCREW 4.0 PLY 4.0X30 (Screw) ×12 IMPLANT
SCREW SET SPINAL (Screw) ×24 IMPLANT
SCREW SET SPINAL TALL (Screw) ×6 IMPLANT
SPONGE LAP 4X18 RFD (DISPOSABLE) ×6 IMPLANT
STAPLER SKIN PROX 35W (STAPLE) ×3 IMPLANT
STRIP CLOSURE SKIN 1/2X4 (GAUZE/BANDAGES/DRESSINGS) IMPLANT
STRIP CLOSURE SKIN 1/4X4 (GAUZE/BANDAGES/DRESSINGS) ×2 IMPLANT
SURGIFLO W/THROMBIN 8M KIT (HEMOSTASIS) ×18 IMPLANT
SURGILUBE 2OZ TUBE FLIPTOP (MISCELLANEOUS) IMPLANT
SUT BONE WAX W31G (SUTURE) ×3 IMPLANT
SUT ETHILON 3 0 FSL (SUTURE) IMPLANT
SUT PDS AB 1 CTX 36 (SUTURE) ×3 IMPLANT
SUT PROLENE 0 CT (SUTURE) ×3 IMPLANT
SUT PROLENE 2 0 CT2 30 (SUTURE) ×3 IMPLANT
SUT PROLENE 3 0 CT 1 (SUTURE) ×3 IMPLANT
SUT VIC AB 0 CTB1 27 (SUTURE) ×6 IMPLANT
SUT VIC AB 1 CT1 18XCR BRD 8 (SUTURE) ×1 IMPLANT
SUT VIC AB 1 CT1 8-18 (SUTURE) ×2
SUT VIC AB 1 CTX 18 (SUTURE) ×3 IMPLANT
SUT VIC AB 1 CTX 36 (SUTURE) ×4
SUT VIC AB 1 CTX36XBRD ANBCTR (SUTURE) ×2 IMPLANT
SUT VIC AB 2-0 CT1 18 (SUTURE) ×9 IMPLANT
SUT VIC AB 2-0 CT2 18 VCP726D (SUTURE) IMPLANT
SYR BULB IRRIGATION 50ML (SYRINGE) ×3 IMPLANT
SYR CONTROL 10ML LL (SYRINGE) ×3 IMPLANT
TAP SPINAL 3.0 (Spine Construct) ×3 IMPLANT
TAP SPINAL 3.5 (Spine Construct) ×3 IMPLANT
TOWEL OR 17X24 6PK STRL BLUE (TOWEL DISPOSABLE) ×3 IMPLANT
TOWEL OR 17X26 10 PK STRL BLUE (TOWEL DISPOSABLE) ×3 IMPLANT
TRAY FOLEY MTR SLVR 16FR STAT (SET/KITS/TRAYS/PACK) ×3 IMPLANT
WATER STERILE IRR 1000ML POUR (IV SOLUTION) IMPLANT
YANKAUER SUCT BULB TIP NO VENT (SUCTIONS) ×3 IMPLANT

## 2018-10-05 NOTE — Evaluation (Signed)
Physical Therapy Evaluation Patient Details Name: Russell MetzClifford W Montoya MRN: [id number] DOB: 07/05/1945 Today's Date: 10/05/2018   History of Present Illness  73 yo male s/p C5-T2 posterior fusion on 10/05/18. PMH includes ACDF 2013, pre-DM, HTN, HLD, OA, anxiety.   Clinical Impression  Pt presents with moderate neck pain, increased time and effort to perform mobility tasks, and decreased knowledge of spinal precautions. Pt to benefit from acute PT to address deficits. Pt ambulated significant hallway distance with use of RW, pt very steady and mostly limited by neck pain and thirst post-surgery. Pt educated extensively on cervical spinal precautions, handout administered and at bedside. PT instructed pt to perform ankle pumps and quad sets as tolerated for LE circulation. No PT follow up needed at this time. PT to progress mobility as tolerated, and will continue to follow acutely.      Follow Up Recommendations No PT follow up;Supervision for mobility/OOB    Equipment Recommendations  None recommended by PT    Recommendations for Other Services       Precautions / Restrictions Precautions Precautions: Fall;Cervical Precaution Booklet Issued: Yes (comment) Precaution Comments: handout administered, reviewed, and practiced during mobility with pt. Pt reviewed no bending, lifting, twisting, and arching back, as well as no pulling and limit pushing, no reaching >eye level. Restrictions Weight Bearing Restrictions: No      Mobility  Bed Mobility Overal bed mobility: Needs Assistance Bed Mobility: Rolling;Sidelying to Sit;Sit to Sidelying Rolling: Supervision Sidelying to sit: Supervision     Sit to sidelying: Supervision General bed mobility comments: Supervision for safety, verbal cuing for log roll technique.   Transfers Overall transfer level: Needs assistance Equipment used: Rolling walker (2 wheeled) Transfers: Sit to/from Stand Sit to Stand: Supervision         General  transfer comment: Supervision for safety, verbal cuing for hand placement. Pt using mostly LEs to rise.   Ambulation/Gait Ambulation/Gait assistance: Supervision Gait Distance (Feet): 500 Feet Assistive device: Rolling walker (2 wheeled) Gait Pattern/deviations: Step-through pattern;Decreased stride length;Trunk flexed Gait velocity: WFL   General Gait Details: Supervision to min guard for safety, verbal cuing for upright posture x3. Pt with steady gait.   Stairs            Wheelchair Mobility    Modified Rankin (Stroke Patients Only)       Balance Overall balance assessment: Mild deficits observed, not formally tested                                           Pertinent Vitals/Pain Pain Assessment: Faces Faces Pain Scale: Hurts little more Pain Location: neck  Pain Descriptors / Indicators: Sore;Discomfort;Grimacing Pain Intervention(s): Monitored during session;Repositioned;Limited activity within patient's tolerance    Home Living Family/patient expects to be discharged to:: Private residence Living Arrangements: Spouse/significant other Available Help at Discharge: Family;Available 24 hours/day Type of Home: House Home Access: Stairs to enter   Enter[employer] CorporationEntrance Stairs-Number of Steps: 3 Home Layout: Two level;Able to live on main level with bedroom/bathroom Home Equipment: Dan HumphreysWalker - 2 wheels;Shower seat - built in      Prior Function Level of Independence: Independent               Hand Dominance   Dominant Hand: Right    Extremity/Trunk Assessment   Upper Extremity Assessment Upper Extremity Assessment: Defer to OT evaluation(pt reporting mild tingling in hands, which is  improved vs pre-surgery)    Lower Extremity Assessment Lower Extremity Assessment: Overall WFL for tasks assessed    Cervical / Trunk Assessment Cervical / Trunk Assessment: Kyphotic  Communication   Communication: No difficulties  Cognition Arousal/Alertness:  Awake/alert Behavior During Therapy: WFL for tasks assessed/performed Overall Cognitive Status: Within Functional Limits for tasks assessed                                 General Comments: Pt talkative and is a loud talker      General Comments      Exercises     Assessment/Plan    PT Assessment Patient needs continued PT services  PT Problem List Decreased mobility;Decreased safety awareness;Decreased range of motion;Decreased knowledge of precautions;Decreased activity tolerance;Decreased balance;Decreased knowledge of use of DME;Pain       PT Treatment Interventions DME instruction;Functional mobility training;Balance training;Patient/family education;Gait training;Therapeutic activities;Neuromuscular re-education;Therapeutic exercise;Stair training    PT Goals (Current goals can be found in the Care Plan section)  Acute Rehab PT Goals Patient Stated Goal: go home PT Goal Formulation: With patient Time For Goal Achievement: 10/19/18 Potential to Achieve Goals: Good    Frequency Min 5X/week   Barriers to discharge        Co-evaluation               AM-PAC PT "6 Clicks" Mobility  Outcome Measure Help needed turning from your back to your side while in a flat bed without using bedrails?: None Help needed moving from lying on your back to sitting on the side of a flat bed without using bedrails?: None Help needed moving to and from a bed to a chair (including a wheelchair)?: A Little Help needed standing up from a chair using your arms (e.g., wheelchair or bedside chair)?: A Little Help needed to walk in hospital room?: A Little Help needed climbing 3-5 steps with a railing? : A Little 6 Click Score: 20    End of Session Equipment Utilized During Treatment: Gait belt;Cervical collar Activity Tolerance: Patient tolerated treatment well Patient left: in bed;with bed alarm set;with call bell/phone within reach;with SCD's reapplied Nurse  Communication: Mobility status PT Visit Diagnosis: Other abnormalities of gait and mobility (R26.89);Pain Pain - Right/Left: (mid) Pain - part of body: (neck)    Time: 4967-5916 PT Time Calculation (min) (ACUTE ONLY): 23 min   Charges:   PT Evaluation $PT Eval Low Complexity: 1 Low PT Treatments $Gait Training: 8-22 mins       Nicola Police, PT Acute Rehabilitation Services Pager [phone]  Office [phone]  Tyrone Apple D Despina Hidden 10/05/2018, 5:02 PM

## 2018-10-05 NOTE — Progress Notes (Signed)
Patient arrived to the unit alert and Oriented X 4 C/o Pain 5/10 Pain, Nurse treat with PRN medications Skin clean dry and intact Midline Posterior cervical Surgical incision, Wound vac in place and collar on All questions and concerns addressed Bed in the lowest with alarm set. Call light in reach Nurse will continue to monitor

## 2018-10-05 NOTE — Op Note (Signed)
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  First Assistant: Glynis Smiles, PA  Complications: None  Implants: [place] [employer] posterior lateral mass screw fixation.  3.5 x 14 mm length screws C5, C6, and right C7.  3.5 x 16 mm screw left C7.  4.0 x 30 mm length screws T1 and T2.  Cross-link placed at the C7 level.  Allograft:Vivogen  Indications: Russell Peterson a very pleasant 73 year old gentleman who had a previous C3-7 ACDF.  He significant neck pain radiating into the scapula.  Imaging studies demonstrated a pseudoarthrosis at C6-7 and anterior listhesis at C7-T1.  As a result of the failure of conservative management and progressive debilitating pain we elected to move forward with surgery.  Surgical plan was posterior supplemental instrumentation and arthrodesis C5-T2.  Operative report: Is brought the operating room placed upon the operating table.  After successful induction of general anesthesia and endotracheal patient teds SCDs and a Foley were inserted.  He was turned prone onto the pro axis spine frame and all bony prominences were well-padded.  The shoulders were taped down and the posterior cervical thoracic spine was prepped and draped in standard fashion.  Timeout was taken to confirm patient procedure and all other important data.  I marked out the incision site spanning from the superior aspect of the C5 this process to the inferior aspect of the T3 spinous process I infiltrated the incision with quarter percent again with epinephrine.  Longitudinal incision was made and sharp dissection was carried out down to the deep fascia.  Deep fascia was sharply incised in the midline to expose the spinous processes of C5-T2.  Using Bovie and Cobb I stripped the paraspinal muscles to expose the lamina and lateral mass of C5, C6, and C7 as well as the rib  articulation to the thoracic spine at T1 and T2.  Once I had the exposure bilaterally I irrigated the wound and then confirmed the levels with fluoroscopy.  Using a standard margerl technique.  The starting position was marked out in the inferior medial quadrant and the was used to broach the cortex.  Drill was then used advanced at the appropriate angulation into the C5 lateral mass.  The drill was aimed lateral and cranially according to a standard technique.  Once the drill was down to 14 mm I then probed the hole tapped and then reprobed the hole.  I repeated this exact same procedure at C6 and C7 and on the contralateral side at C5-6 and 7.  Once all the lateral mass screw holes were properly positioned I then turned to placing my pedicle screws.  Using the AP view I identified the T1 pedicle.  I then used a high-speed bur to broach the cortex and use my pedicle awl to advance towards the pedicle.  Using the AP view fluoroscopy view I advanced the pedicle probe into the pedicle aiming towards the medial wall.  Once I was approximately 2 mm away from the medial wall I then switched my view to the lateral view.  On this view I confirmed that I was just at the posterior wall the vertebral body.  Once confirmed I was able to advance the pedicle probe into the vertebral body confirming that I had adequate position and trajectory.  I then remove the pedicle awl and then palpated the hole with a ball-tipped feeler and then tapped and then repalpated the hole.  I then  placed a 30 mm 4.0 diameter pedicle screw at this level.  I repeated this exact same procedure and technique at T2 and on the contralateral side at T1 and T2.  With the thoracic pedicle screws in place I then went back into the cervical spine and placed the lateral mass screws at each level.  All screws had excellent purchase and were well fixed.  At this point I irrigated the wound copiously with normal saline and made sure I had hemostasis using  bipolar electrocautery.  I also supplemented this with FloSeal.  I then took the appropriate size rod contoured it and secured it into the screw construct.  Once both rods were secured into place I torqued off all the knots according manufacture standards.  I then used a high-speed bur to decorticate the remaining lamina and facet joint at each level.  I left the reamings to aid in postoperative fusion.  I then packed the posterior gutter with the allograft.  A single cross-link was placed in order to improve the stability of the construct at the C7 level.  At this point with the fixation complete I took final intraoperative fluoroscopy views and I was pleased with the hardware position and fixation.  At this point to aid in postoperative analgesia I infiltrated the paraspinal muscles with the remaining amount of quarter percent Marcaine mixed with Exparel.  I then closed the deep fascia with interrupted #1 Vicryl sutures superficial with 2-0 Vicryl suture and then a #2-0 Prolene for the skin.  The incisional wound VAC was then applied and the patient was ultimately extubated and transferred the PACU without incident.  At the end of the case all needle sponge counts were correct.  There were no adverse intraoperative events.

## 2018-10-05 NOTE — Discharge Instructions (Signed)
Today you will be discharged from the hospital.  The purpose of the following handout is to help guide you over the next 2 weeks.  First and foremost, be sure you have a follow up appointment with Dr. Shon BatonBrooks 5 days from the time of your surgery to have your wound check.  Please call [facility] [phone] to schedule or confirm this appointment.      Brace You do not have to wear the collar while lying in bed or sitting in a high-backed chair, eating, sleeping or showering.  Other than these instances, you must wear the brace.  You may NOT wear the collar while driving a vehicle (see driving restrictions below).  It is advisable that you wear the collar in public places or while traveling in a car as a passenger.  Dr. Shon BatonBrooks will discuss further use of the collar at your 2 week postop visit.  Wound Care You may SHOWER 5 days from the date of surgery.  Shower directly over the steri-strips.  DO NOT scrub or submerge (bath tub, swimming pool, hot tub, etc.) the area.  Pat to dry following your shower.  There is no need for additional dressings other than the steri-strips.  Allow the steri-strips to fall off on their own.  Once the strips have fallen off, you may leave the area undressed.  DO NOT apply lotion/cream/ointment to the area.  The wound must remain dry at all times other than while showering.  Dr. Shon BatonBrooks or his staff will remove your stiches at your first postop visit and give you additional instructions regarding wound care at that time.   Activity NO DRIVING FOR 2 WEEKS.  No lifting over 5 pounds (approximately a gallon of milk).  No bending, stooping, squatting or twisting.  No overhead activities.  We encourage you to walk (short distances and often throughout the day) as you can tolerate.  A good rule of thumb is to get up and move once or twice every hour.  You may go up and down stairs carefully.  As you continue to recover, Dr. Shon BatonBrooks will address and adjust  restrictions to your activities until no further restrictions are needed.  However, until your first postop visit, when Dr. Shon BatonBrooks can assess your recovery, you are to follow these instructions.  At the end of this document is a tentative outline of activities for up to 1 year.       Medication You will be discharged from the hospital with medication for pain, spasm, nausea and constipation.  You will be given enough medication to last until your first postop visit in 2 weeks.  Medications WILL NOT BE REFILLED EARLY; therefore, you are to take the medications only as directed.  If you have been given multiple prescriptions, please leave them with your pharmacy.  They can keep them on file for when you need them.  Medications that are lost or stolen WILL NOT be replaced.  We will address the need for continuing certain medications on an individual basis during your postop visit.  We ask that you avoid over the counter anti-inflammatory medications (Advil, Aleve, Motrin) for 3 months.    What you can expect following neck surgery... It is not uncommon to experience a sore throat or difficulty swallowing following neck surgery.  Cold liquids and soft foods are helpful in soothing this discomfort.  There is no specific diet that you are to follow after surgery, however, there are a  few things you should keep in mind to avoid unneeded discomfort.  Take small bites and eat slowly.  Chew your food thoroughly before swallowing.   It is not uncommon to experience incisional soreness or pain in the back of the neck, shoulders or between the shoulder blades.  These symptoms will slowly begin to resolve as you continue to recover, however, they can last for a few weeks.    It is not uncommon to experience INTERMITTENT arm pain following surgery.  This pain can mimic the arm pain you had prior to surgery.  As long as the pain resolves on its own and is not constant, there is no need to become alarmed.   When To  Call If you experience fever >101F, loss of bowel or bladder control, painful swelling in the lower extremities, constant (unresolving) arm pain.  If you experience any of these symptoms, please call [facility], Care After Refer to this sheet in the next few weeks. These instructions provide you with information about caring for yourself after your procedure. Your health care provider may also give you more specific instructions. Your treatment has been planned according to current medical practices, but problems sometimes occur. Call your health care provider if you have any problems or questions after your procedure. What can I expect after the procedure? It is common to have pain for the first few days after the procedure. Some people continue to have mild pain even after making a full recovery. Follow these instructions at home: Medicine  Take medicines only as directed by your health care provider.  Avoid taking over-the-counter pain medicines unless your health care provider tells you otherwise. These medicines interfere with the development and growth of new bone cells.  If you were prescribed a narcotic pain medicine, take it exactly as told by your health care provider. ? Do not drink alcohol while on the medicine. ? Do not drive while on the medicine. Injury care  Care for your back brace as told by your health care provider.  If directed, apply ice to the injured area: ? Put ice in a plastic bag. ? Place a towel between your skin and the bag. ? Leave the ice on for 20 minutes, 2-3 times a day. Activity  Perform physical therapy exercises as told by your health care provider.  Exercise regularly. Start by taking short walks. Slowly increase your activity level over time. Gentle exercise helps to ease pain.  Sit, stand, walk, turn in bed, and reposition yourself as told by your health care provider. This will help to keep your spine in proper  alignment.  Avoid bending and twisting your body.  Avoid doing strenuous household chores, such as vacuuming.  Do not lift anything that is heavier than 10 lb (4.5 kg). Other Instructions  Keep all follow-up visits as directed by your health care provider. This is important.  Do not use any tobacco products, including cigarettes, chewing tobacco, or electronic cigarettes. If you need help quitting, ask your health care provider. Nicotine affects the way bones heal. Contact a health care provider if:  Your pain gets worse.  You have a fever.  You have redness, swelling, or pain at the site of your incision.  You have fluid, blood, or pus coming from your incision.  You have numbness, tingling, or weakness in any part of your body. Get help right away if:  Your incision feels swollen and tender, and the surrounding area looks like a lump.  The lump may be red or bluish in color.  You cannot move any part of your body (paralysis).  You cannot control your bladder or bowels. This information is not intended to replace advice given to you by your health care provider. Make sure you discuss any questions you have with your health care provider.[facility] [phone].  What's Next As mentioned earlier, you will follow up with Dr. Shon Baton in 2 weeks.  At that time, we will likely remove your stitches and discuss additional aspects of your recovery.                   ACTIVITY GUIDELINES ANTERIOR CERVICAL DISECTOMY AND FUSION  Activity Discharge 2 weeks 6 weeks 3 months 6 months 1 year  Shower 5 days        Submerge the wound  no no yes     Walking outdoors yes       Lifting 5 lbs yes       Climbing stairs yes       Cooking yes       Car rides (less than 30 minutes) yes       Car rides (greater than 30 minutes) no varies yes     Air travel no varies yes     Short outings [employer], visits, etc...) yes       School no no yes     Driving a car no no varies yes     Light upper extremity exercises no no varies yes    Stationary bike no no yes     Swimming (no diving) no no no varies yes   Vacuuming, laundry, mopping no no no varies yes   Biking outdoors no no no no varies yes  Light jogging no no no varies yes   Low impact aerobics no no no varies yes   Non-contact sports (tennis, golf) no no no varies yes   Hunting (no tree climbing) no no no varies yes   Dancing (non-gymnastics) no no no varies yes   Down-hill skiing (experienced skier) no no no no yes   Down-hill skiing (novice) no no no no yes   Cross-country skiing no no no no yes   Horseback riding (noncompetitive)  no no no no yes   Horseback riding (competitive) no no no no varies yes  Gardening/landscaping no no no varies yes   House repairs no no no varies varies yes  Lifting up to 50 lbs no no no no varies yes

## 2018-10-05 NOTE — Transfer of Care (Signed)
Immediate Anesthesia Transfer of Care Note  Patient: Russell Peterson  Procedure(s) Performed: Posterior cervical-thoracic fusion Cervical five 50 Thoracic Two (N/A Spine Cervical)  Patient Location: PACU  Anesthesia Type:General  Level of Consciousness: drowsy and patient cooperative  Airway & Oxygen Therapy: Patient Spontanous Breathing and Patient connected to face mask oxygen  Post-op Assessment: Report given to RN and Post -op Vital signs reviewed and stable  Post vital signs: Reviewed and stable  Last Vitals:  Vitals Value Taken Time  BP 98/59 10/05/2018 12:07 PM  Temp    Pulse 88 10/05/2018 12:08 PM  Resp 15 10/05/2018 12:08 PM  SpO2 95 % 10/05/2018 12:08 PM  Vitals shown include unvalidated device data.  Last Pain:  Vitals:   10/05/18 0612  TempSrc:   PainSc: 6       Patients Stated Pain Goal: 3 (10/05/18 0612)  Complications: No apparent anesthesia complications

## 2018-10-05 NOTE — Anesthesia Procedure Notes (Signed)
Procedure Name: Intubation Date/Time: 10/05/2018 7:53 AM Performed by: Modena Morrow, CRNA Pre-anesthesia Checklist: Patient identified, Emergency Drugs available, Suction available, Patient being monitored and Timeout performed Patient Re-evaluated:Patient Re-evaluated prior to induction Oxygen Delivery Method: Circle system utilized Preoxygenation: Pre-oxygenation with 100% oxygen Induction Type: IV induction, Rapid sequence and Cricoid Pressure applied Laryngoscope Size: Miller and 2 Grade View: Grade I Tube size: 7.5 mm Number of attempts: 1 Airway Equipment and Method: Patient positioned with wedge pillow and Stylet Placement Confirmation: positive ETCO2,  ETT inserted through vocal cords under direct vision and breath sounds checked- equal and bilateral Secured at: 22 cm Tube secured with: Tape Dental Injury: Teeth and Oropharynx as per pre-operative assessment

## 2018-10-05 NOTE — Brief Op Note (Signed)
10/05/2018  12:13 PM  PATIENT:  Russell Peterson  73 y.o. male  PRE-OPERATIVE DIAGNOSIS:  C6-7 pseudoarthrosis with C7-T1 slip  POST-OPERATIVE DIAGNOSIS:  C6-7 pseudoarthrosis with C7-T1 slip  PROCEDURE:  Procedure(s) with comments: Posterior cervical-thoracic fusion Cervical five 50 Thoracic Two (N/A) -  SURGEON:  Surgeon(s) and Role:    Venita Lick, MD - Primary  PHYSICIAN ASSISTANT:   ASSISTANTS: Amanda Ward    ANESTHESIA:   general  EBL:  300 mL   BLOOD ADMINISTERED:none  DRAINS: none   LOCAL MEDICATIONS USED:  MARCAINE    and OTHER exparel  SPECIMEN:  No Specimen  DISPOSITION OF SPECIMEN:  N/A  COUNTS:  YES  TOURNIQUET:  * No tourniquets in log *  DICTATION: .Dragon Dictation  PLAN OF CARE: Admit for overnight observation  PATIENT DISPOSITION:  PACU - hemodynamically stable.

## 2018-10-05 NOTE — Progress Notes (Signed)
Pt ambulated in hallways 250 ft with RN supervision using a walker. Pt back to bed; call light within reach. Will continue to closely monitor. Dionne Bucy RN

## 2018-10-05 NOTE — H&P (Signed)
HPI For associated symptoms, patient reports numbness (both arms) and tingling. For location, he reports bilateral (arm pain). For duration, he reports 1-2 years. For quality, he reports aching, dull, constant, and worsening. For severity, he reports pain level 8/10. For alleviating factors, he reports narcotics and previous surgery. For prior imaging, he reports mri ([facility] in [place]) and ct scan (09/08/18 [facility]).  As a result of the progression in his severe pain and intermittent dysesthesias we have elected to move forward with posterior cervical fusion to address the pseudoarthrosis at C6-7 and the anterior listhesis at C7-T1.   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  General: AAOX3, well developed and well nourished, NAD Ambulation: normal gait pattern, uses no assistive device. Inspection: No obvious deformity, scoleosis, kyphosis, loss of lordotic curve. Incision from previous surgery on RIGHT. Heart: RRR, no rubs, murmers, or gallops Lungs: CTAB Abdomen: Normal BSX4, non-tender,  non-distended, no hepatosplenomegaly.  Palpation: Tender over spinous processes and neck musculature.  AROM: Neck ROM decreased due to previous 4 level cervical fusion. - Fwd Flexion: decreased - Extension: decreased - Lateral bending to left: decreased - Lateral Bending to right: decreased - Rotation to Left: decreased - Rotation to Right: decreased -Shoulder, elbow, and wrists AROM normal and pain free.  Dermatomes: UE dermatomes abnormal to light touch bilaterally. Positive dysethesias bilaterally.  Myotomes: - shoulder shrug: Left 5/5, Right 5/5 -Shoulder Abduction: Left 5/5, Right 5/5 - Elbow flexion: Left 5/5, Right 5/5 - Elbow extension Left 5/5, Right 4+/5 - Finger abduction: Left 5/5, Right 5/5 - finger Adduction/squeeze: Left 5/5, Right 5/5  Reflexes: - Biceps: Left1+, Right 1+ - Brachioradialius: Left1+, Right 1+ - Triceps: Left 1+, Right 1+ - Hoffman's: Negative  Special Tests: - UE Neural tension test: Left Negative, Right Negative -Rhomberg: Negative - Heel to toe walking Negative  PV: Extremities warm and well profused. Distal pulses 2+ bilaterally.  X-rays demonstrate questionable pseudoarthrosis at C6-7. There is some slight anterior migration of the C7 locking screws. No acute fracture of the hardware. No so subsidence of the intervertebral cages.  CT scan: completed on 09/08/18 was reviewed with the patient. I have also reviewed the radiology report. Positive pseudoarthrosis at C6-7 consistent with plain x-ray findings. Multilevel foraminal stenosis is noted in the cervical spine. Moderate left foraminal stenosis C4-5 and C6-7, bilateral foraminal stenosis C5-6. There is also a slight degenerative anterolisthesis at C7-T1 with both disc and facet degeneration causing moderate stenosis bilaterally.  Assessment & Plan Patient reports ongoing significant neck pain radiating into the upper extremities. He denies any focal weakness in the upper extremities or  abnormal gait pattern. His primary complaint is severe neck pain with some intermittent dysesthesias into the upper extremities.  CT scan: completed on 09/08/18  was reviewed with the patient. I have also reviewed the radiology report. Positive pseudoarthrosis at C6-7 consistent with plain x-ray findings. Multilevel foraminal stenosis is noted in the cervical spine. Moderate left foraminal stenosis C4-5 and C6-7, bilateral foraminal stenosis C5-6. There is also a slight degenerative anterolisthesis at C7-T1 with both disc and facet degeneration causing moderate stenosis bilaterally.  At this point time the CT scan shows the pseudoarthrosis as well as a slight anterior listhesis at C7-T1 which I believe is his primary source of pain. Given the absence of any focal neurological deficits and only intermittent dysesthesias I do not think the foraminal stenosis is causing clinically significant nerve compression. At this point the patient is interested in moving forward with a surgical solution for his severe pain. More likely this would involve a posterior supplemental fixation procedure to address the pseudoarthrosis.  I have gone over the CT scan with the patient and demonstrated the pathology at the C6-7 and C7-T1 levels. In order to address this I am recommending a posterior C5-T2 instrument fusion and arthrodesis. This will stabilize the slip at C7-T1 and addressed the pseudoarthrosis at C6-7. This should provide adequate fixation points for stabilization and bone graft formation. 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  I have gone over the surgical procedure as well as the risks with him again and all of his questions were addressed.  Risks include: Infection, bleeding, death, stroke, paralysis, ongoing or worse pain, failure to fuse, hardware failure or breakage, nerve injury, spinal cord injury, need for additional surgery.  Also  risk his wound healing complications.  The patient is expressed understanding of these and we are moving forward with surgery.

## 2018-10-06 DIAGNOSIS — M1990 Unspecified osteoarthritis, unspecified site: Secondary | ICD-10-CM | POA: Diagnosis present

## 2018-10-06 DIAGNOSIS — Z7982 Long term (current) use of aspirin: Secondary | ICD-10-CM | POA: Diagnosis not present

## 2018-10-06 DIAGNOSIS — Z79891 Long term (current) use of opiate analgesic: Secondary | ICD-10-CM | POA: Diagnosis not present

## 2018-10-06 DIAGNOSIS — E785 Hyperlipidemia, unspecified: Secondary | ICD-10-CM | POA: Diagnosis present

## 2018-10-06 DIAGNOSIS — I10 Essential (primary) hypertension: Secondary | ICD-10-CM | POA: Diagnosis present

## 2018-10-06 DIAGNOSIS — M4313 Spondylolisthesis, cervicothoracic region: Secondary | ICD-10-CM | POA: Diagnosis present

## 2018-10-06 DIAGNOSIS — G4730 Sleep apnea, unspecified: Secondary | ICD-10-CM | POA: Diagnosis present

## 2018-10-06 DIAGNOSIS — F419 Anxiety disorder, unspecified: Secondary | ICD-10-CM | POA: Diagnosis present

## 2018-10-06 DIAGNOSIS — M4802 Spinal stenosis, cervical region: Secondary | ICD-10-CM | POA: Diagnosis present

## 2018-10-06 DIAGNOSIS — Z96619 Presence of unspecified artificial shoulder joint: Secondary | ICD-10-CM | POA: Diagnosis present

## 2018-10-06 DIAGNOSIS — M960 Pseudarthrosis after fusion or arthrodesis: Secondary | ICD-10-CM | POA: Diagnosis present

## 2018-10-06 DIAGNOSIS — Y838 Other surgical procedures as the cause of abnormal reaction of the patient, or of later complication, without mention of misadventure at the time of the procedure: Secondary | ICD-10-CM | POA: Diagnosis present

## 2018-10-06 DIAGNOSIS — R7303 Prediabetes: Secondary | ICD-10-CM | POA: Diagnosis present

## 2018-10-06 DIAGNOSIS — Z978 Presence of other specified devices: Secondary | ICD-10-CM | POA: Diagnosis not present

## 2018-10-06 DIAGNOSIS — Z79899 Other long term (current) drug therapy: Secondary | ICD-10-CM | POA: Diagnosis not present

## 2018-10-06 DIAGNOSIS — Z791 Long term (current) use of non-steroidal anti-inflammatories (NSAID): Secondary | ICD-10-CM | POA: Diagnosis not present

## 2018-10-06 MED ORDER — DIPHENHYDRAMINE HCL 50 MG/ML IJ SOLN
50.0000 mg | Freq: Three times a day (TID) | INTRAMUSCULAR | Status: DC | PRN
  Administered 2018-10-06: 50 mg via INTRAVENOUS
  Filled 2018-10-06: qty 1

## 2018-10-06 MED ORDER — ZOLPIDEM TARTRATE 5 MG PO TABS
5.0000 mg | ORAL_TABLET | Freq: Every evening | ORAL | Status: DC | PRN
  Administered 2018-10-06: 5 mg via ORAL
  Filled 2018-10-06: qty 1

## 2018-10-06 NOTE — Progress Notes (Signed)
    Subjective: Procedure(s) (LRB): Posterior cervical-thoracic fusion Cervical five 50 Thoracic Two (N/A) 1 Day Post-Op  Patient reports pain as 2 on 0-10 scale.  Reports deferred at this time arm pain reports incisional neck pain   Positive void Negative bowel movement Positive flatus Negative chest pain or shortness of breath  Objective: Vital signs in last 24 hours: Temp:  [97.8 F (36.6 C)-98.9 F (37.2 C)] 98.5 F (36.9 C) (05/08 0433) Pulse Rate:  [80-113] 97 (05/08 0433) Resp:  [11-17] 17 (05/08 0433) BP: (98-143)/(58-84) 135/64 (05/08 0433) SpO2:  [93 %-98 %] 95 % (05/08 0433)  Intake/Output from previous day: 05/07 0701 - 05/08 0700 In: 1850 [I.V.:1500; IV Piggyback:350] Out: 1200 [Urine:900; Blood:300]  Labs: No results for input(s): WBC, RBC, HCT, PLT in the last 72 hours. No results for input(s): NA, K, CL, CO2, BUN, CREATININE, GLUCOSE, CALCIUM in the last 72 hours. No results for input(s): LABPT, INR in the last 72 hours.  Physical Exam: Neurologically intact ABD soft Intact pulses distally Incision: dressing C/D/I and no drainage Compartment soft Body mass index is 28.06 kg/m.  Assessment/Plan: Patient stable  xrays n/a Mobilization with physical therapy Encourage incentive spirometry Continue care  Advance diet Up with therapy  Doing well overall Dressing intact - will follow up on Tuesday for evaluation of the wound  Venita Lick, MD [facility] [phone]

## 2018-10-06 NOTE — Progress Notes (Signed)
Pt c/o difficulty sleeping and requested for something to help him; MD on-call notified and new orders received. Will continue to closely monitor. Dionne Bucy RN

## 2018-10-06 NOTE — Anesthesia Postprocedure Evaluation (Signed)
Anesthesia Post Note  Patient: Russell Peterson  Procedure(s) Performed: Posterior cervical-thoracic fusion Cervical five 50 Thoracic Two (N/A Spine Cervical)     Anesthesia Post Evaluation  Last Vitals:  Vitals:   10/06/18 0010 10/06/18 0433  BP: (!) 125/58 135/64  Pulse: 92 97  Resp: 17 17  Temp: 37.2 C 36.9 C  SpO2: 98% 95%    Last Pain:  Vitals:   10/06/18 0433  TempSrc: Oral  PainSc:    Pain Goal: Patients Stated Pain Goal: 0 (10/06/18 0200)                 [name] S

## 2018-10-06 NOTE — Progress Notes (Signed)
OT Cancellation Note  Patient Details Name: Russell Peterson MRN: [id number] DOB: 25-Mar-1946   Cancelled Treatment:    Reason Eval/Treat Not Completed: Other (comment)(discharged) OT arriving to the room and patient has discharged. NO evaluation complete at this time.0  Fontaine No, OTR/L  Acute Rehabilitation Services Pager: [phone] Office: [phone] .  10/06/2018, 10:42 AM

## 2018-10-09 ENCOUNTER — Encounter (HOSPITAL_COMMUNITY): Payer: Self-pay | Admitting: Orthopedic Surgery

## 2018-10-11 NOTE — Discharge Summary (Signed)
Patient ID: Russell Peterson MRN: [id number] DOB/AGE: 1945-10-25 73 y.o.  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 Anxiety   . Arthritis   . Costochondritis   . Hyperlipidemia   . Hypertension    dr Gordy Councilman    in [place]  [place]  . Pre-diabetes     Surgeries: Procedure(s): Posterior cervical-thoracic fusion Cervical five thorugh Thoracic Two on 10/05/2018   Consultants: none  Discharged Condition: Improved  Hospital Course: Russell Peterson is an 73 y.o. male who was admitted 10/05/2018 for operative treatment of <principal problem not specified>. Patient failed conservative treatments (please see the history and physical for the specifics) and had severe unremitting pain that affects sleep, daily activities and work/hobbies. After pre-op clearance, the patient was taken to the operating room on 10/05/2018 and underwent  Procedure(s): Posterior cervical-thoracic fusion Cervical five through Thoracic Two.    Patient was given perioperative antibiotics:  Anti-infectives (From admission, onward)   Start     Dose/Rate Route Frequency Ordered Stop   10/05/18 1600  ceFAZolin (ANCEF) IVPB 1 g/50 mL premix     1 g 100 mL/hr over 30 Minutes Intravenous Every 8 hours 10/05/18 1509 10/06/18 0035   10/05/18 0600  vancomycin (VANCOCIN) IVPB 1000 mg/200 mL premix     1,000 mg 200 mL/hr over 60 Minutes Intravenous To ShortStay Surgical 10/04/18 0746 10/05/18 2136       Patient was given sequential compression devices and early ambulation to prevent DVT.   Patient benefited maximally from hospital stay and there were no complications. At the time of discharge, the patient was urinating/moving their bowels  without difficulty, tolerating a regular diet, pain is controlled with oral pain medications and they have been cleared by PT/OT.   Recent vital signs: No data found.   Recent laboratory studies: No results for input(s): WBC, HGB, HCT, PLT, NA, K, CL, CO2, BUN, CREATININE, GLUCOSE, INR, CALCIUM in the last 72 hours.  Invalid input(s): PT, 2   Discharge Medications:   Allergies as of 10/06/2018   No Known Allergies     Medication List    STOP taking these medications   baclofen 10 MG tablet Commonly known as:  LIORESAL   diclofenac sodium 1 % Gel Commonly known as:  VOLTAREN   gabapentin 400 MG capsule Commonly known as:  NEURONTIN   HYDROcodone-acetaminophen 10-325 MG tablet Commonly known as:  NORCO   traMADol 50 MG tablet Commonly known as:  ULTRAM   vitamin C 500 MG tablet Commonly known as:  ASCORBIC ACID   Vitamin D 50 MCG (2000 UT) tablet   vitamin E 400 UNIT capsule   zolpidem 10 MG tablet Commonly known as:  AMBIEN     TAKE these medications   aspirin EC 81 MG tablet Take 81 mg by mouth daily.   atorvastatin 20 MG tablet Commonly known as:  LIPITOR Take 10 mg by mouth daily.   hydrochlorothiazide 25 MG tablet Commonly known as:  HYDRODIURIL Take 25 mg by mouth daily.   lisinopril 20 MG tablet Commonly known as:  ZESTRIL Take 20 mg by mouth daily.   nortriptyline 50 MG capsule Commonly known as:  PAMELOR Take 50 mg by mouth at bedtime.   ondansetron  4 MG tablet Commonly known as:  Zofran Take 1 tablet (4 mg total) by mouth every 8 (eight) hours as needed for nausea or vomiting.   oxybutynin 5 MG 24 hr tablet Commonly known as:  DITROPAN-XL Take 5 mg by mouth at bedtime.     ASK your doctor about these medications   methocarbamol 500 MG tablet Commonly known as:  Robaxin Take 1 tablet (500 mg total) by mouth every 8 (eight) hours as needed for up to 5 days for muscle spasms. Ask about: Should I take this medication?    oxyCODONE-acetaminophen 10-325 MG tablet Commonly known as:  Percocet Take 1 tablet by mouth every 6 (six) hours as needed for up to 5 days for pain. Ask about: Should I take this medication?       Diagnostic Studies: Dg Chest 2 View  Result Date: 09/27/2018 CLINICAL DATA:  Preop for cervical surgery. EXAM: CHEST - 2 VIEW COMPARISON:  Radiographs of April 20, 2012. FINDINGS: The heart size and mediastinal contours are within normal limits. Both lungs are clear. The visualized skeletal structures are unremarkable. IMPRESSION: No active cardiopulmonary disease. Electronically Signed   By: Lupita RaiderJames  Green Jr M.D.   On: 09/27/2018 15:00   Dg Cervical Spine Complete  Result Date: 10/05/2018 CLINICAL DATA:  73 year old male with cervical fusion EXAM: DG C-ARM 61-120 MIN; CERVICAL SPINE - COMPLETE 4+ VIEW COMPARISON:  09/08/2018 FINDINGS: Limited intraoperative fluoroscopic spot images of the cervical spine. Again demonstrated anterior cervical discectomy with anterior plate screw fixation and disc spacer placement of C3-C7. Surgical changes of posterior cervicothoracic fixation spanning C5-T2 with bilateral pedicle screw and rod fixation, with cross fixation at the C7 pedicle screws. IMPRESSION: Limited intraoperative fluoroscopic spot images, demonstrating posterior cervicothoracic fixation of C5-T2 with cross fixation at C7, as well as remote changes of prior ACDF of C3-C7. Please refer to the dictated operative report for full details of intraoperative findings and procedure. Electronically Signed   By: Gilmer MorJaime  Wagner D.O.   On: 10/05/2018 12:04   Dg C-arm 1-60 Min  Result Date: 10/05/2018 CLINICAL DATA:  73 year old male with cervical fusion EXAM: DG C-ARM 61-120 MIN; CERVICAL SPINE - COMPLETE 4+ VIEW COMPARISON:  09/08/2018 FINDINGS: Limited intraoperative fluoroscopic spot images of the cervical spine. Again demonstrated anterior cervical discectomy with anterior plate screw fixation and disc spacer  placement of C3-C7. Surgical changes of posterior cervicothoracic fixation spanning C5-T2 with bilateral pedicle screw and rod fixation, with cross fixation at the C7 pedicle screws. IMPRESSION: Limited intraoperative fluoroscopic spot images, demonstrating posterior cervicothoracic fixation of C5-T2 with cross fixation at C7, as well as remote changes of prior ACDF of C3-C7. Please refer to the dictated operative report for full details of intraoperative findings and procedure. Electronically Signed   By: Gilmer MorJaime  Wagner D.O.   On: 10/05/2018 12:04    Discharge Instructions    Incentive spirometry RT   Complete by:  As directed       Follow-up Information    Venita LickBrooks, Dahari, MD In 5 days.   Specialty:  Orthopedic Surgery Why:  If symptoms worsen, For suture removal, For wound re-check Contact information: [address] [place] [address] [phone]           Discharge Plan:  discharge to home  Disposition: stable    Signed: Leonette MonarchAmanda  N Ward for [facility] Ward PA-C [facility] [phone] 10/11/2018, 3:29 PM

## 2020-08-17 IMAGING — CT CT CERVICAL SPINE WITHOUT CONTRAST
2 series · 10 of 14 positions shown, 12 images · non-contrast
Comparison: Cervical MRI 04/01/2012

CLINICAL DATA: Cervicalgia.  Cervical fusion 04/26/2012.

EXAM:
CT CERVICAL SPINE WITHOUT CONTRAST
TECHNIQUE: Multidetector CT imaging of the cervical spine was performed without
intravenous contrast. Multiplanar CT image reconstructions were also
generated.

[Series 2: cspine soft (person_name) · axial · 0.27mm/px · z∈[-223,-87]mm · 5 of 103 slices shown]
[im 18/103  soft-tissue]
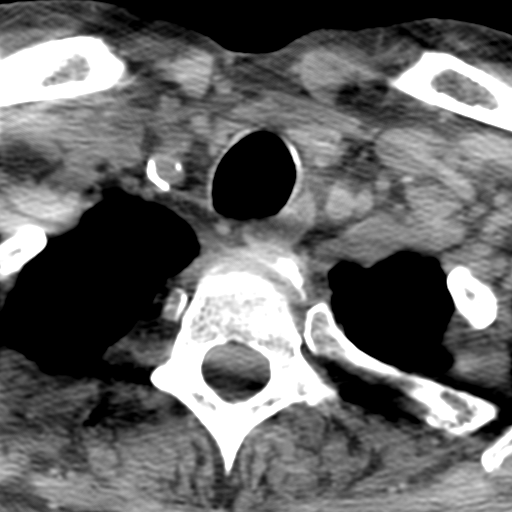
[im 35/103  soft-tissue]
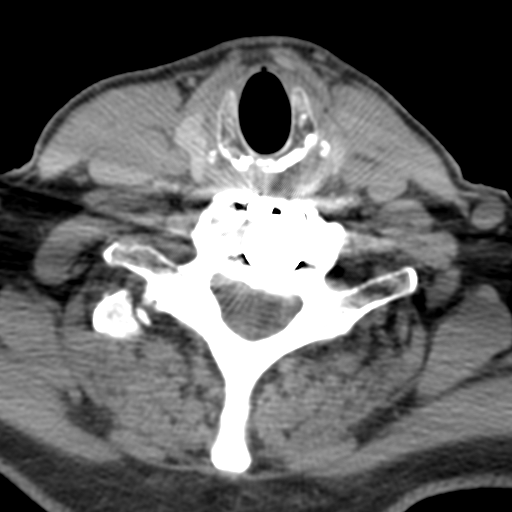
[im 52/103  soft-tissue]
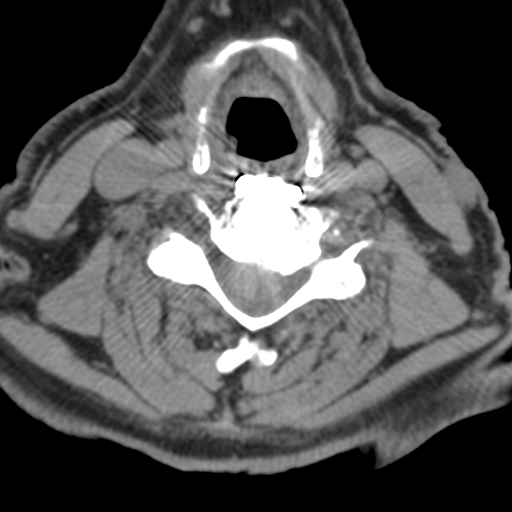
[im 69/103  soft-tissue]
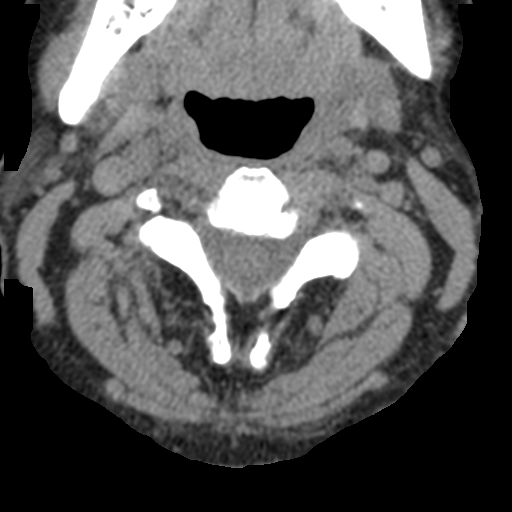
[im 86/103  soft-tissue]
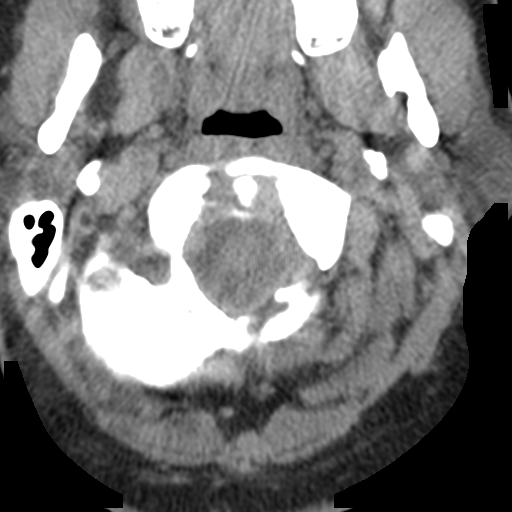

[Series 7: angled axial · axial · 0.29mm/px · z∈[-232,-97]mm · 5 of 103 slices shown, 7 images]
[im 18/103  soft-tissue]
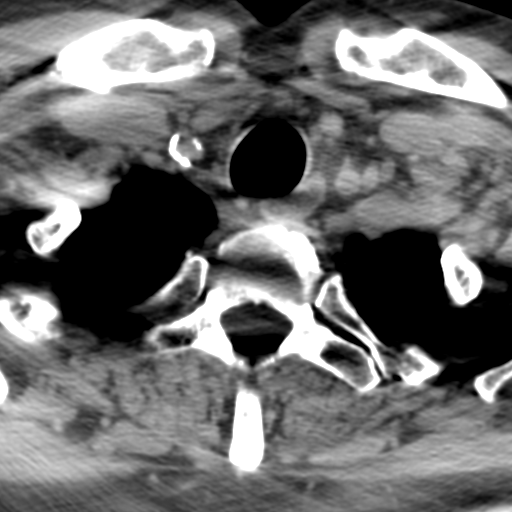
[im 18/103  bone]
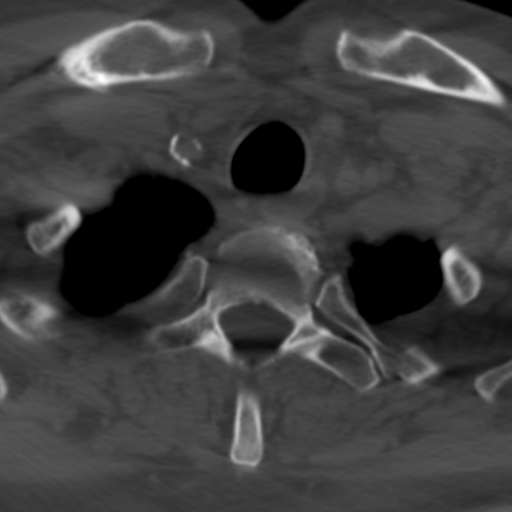
[im 35/103  bone]
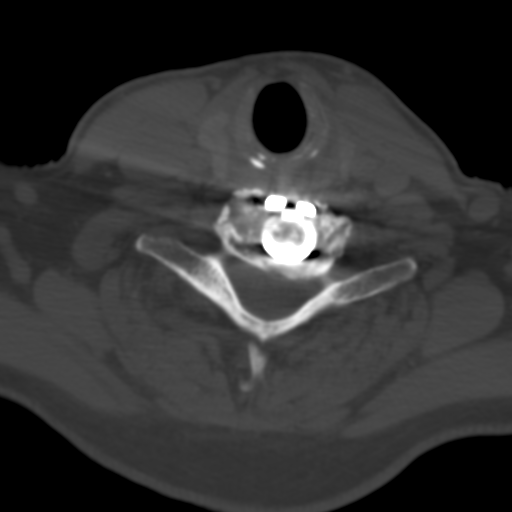
[im 52/103  bone]
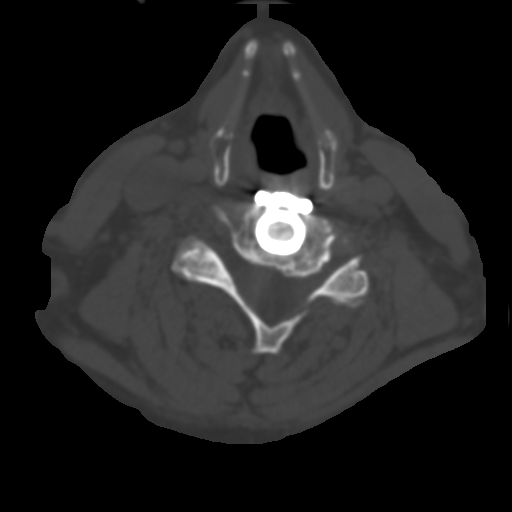
[im 69/103  bone]
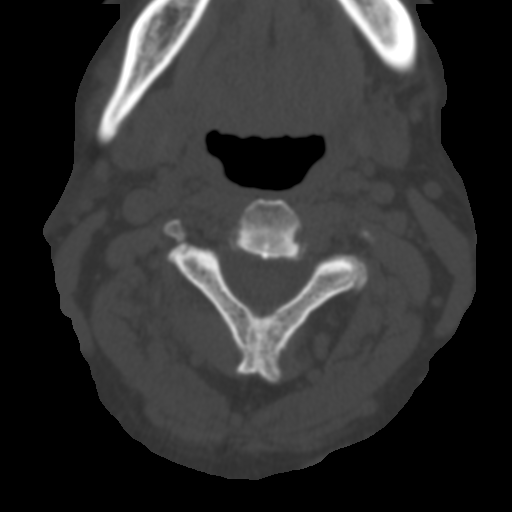
[im 86/103  soft-tissue]
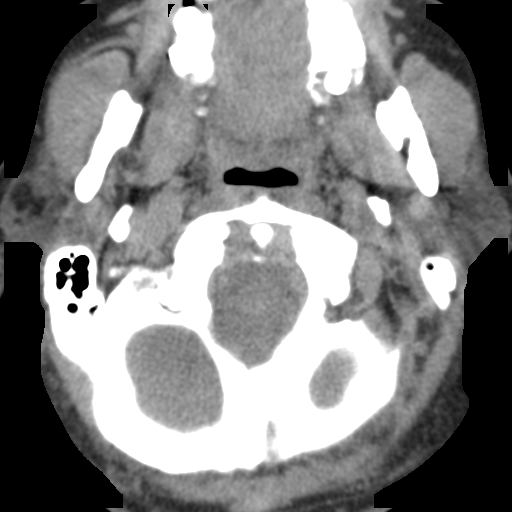
[im 86/103  bone]
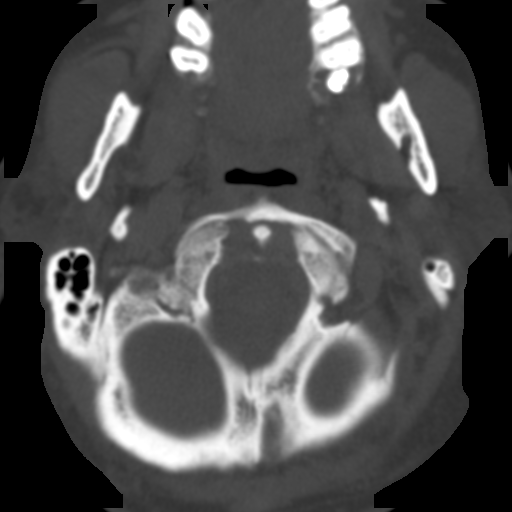

[10 of 14 positions shown; findings below may reference images not displayed]

FINDINGS: Alignment: 3 mm anterolisthesis C7-T1 which has progressed in the
interval. Mild anterolisthesis T1-2 and T2-3 similar to the prior
study.

Skull base and vertebrae: ACDF C3 through C7. Negative for fracture
or mass.

Soft tissues and spinal canal: No paraspinous mass or fluid
collection. Atherosclerotic calcification right subclavian artery.

Disc levels: C1-2: Moderate degenerative change. Mild pannus
formation with calcification in the transverse ligament.

C2-3: Mild disc degeneration without significant stenosis

C3-4: ACDF. Solid fusion. Mild foraminal narrowing bilaterally due
to uncinate spurring and facet degeneration.

C4-5: ACDF with solid fusion. Moderate to severe left foraminal
encroachment due to uncinate spurring. Spinal canal adequate in
size. Right foramen patent.

C5-6: ACDF with solid fusion. Diffuse uncinate spurring with
moderate foraminal narrowing bilaterally

C6-7: ACDF with persistent lucency through the disc space compatible
with pseudarthrosis. Moderate left foraminal stenosis due to
asymmetric spurring.

C7-T1: 3 mm anterolisthesis with advanced disc and facet
degeneration. Moderate foraminal encroachment bilaterally due to
spurring.

Upper chest: Lung apices clear bilaterally.

Other: None
IMPRESSION: 1. ACDF C3 through C7. Solid fusion with exception of C6-7 which
show pseudarthrosis. No acute bony abnormality
2. Multilevel foraminal stenosis due to spurring. There is moderate
to severe left foraminal stenosis C4-5. Moderate foraminal stenosis
bilaterally C5-6. Moderate left foraminal stenosis at C6-7.
3. Degenerative anterolisthesis C7-T1 with disc and facet
degeneration causing moderate foraminal stenosis bilaterally.

## 2020-09-05 IMAGING — CR CHEST - 2 VIEW
2 series · 2 of 2 positions shown · non-contrast
Comparison: Radiographs April 20, 2012.

CLINICAL DATA: Preop for cervical surgery.

EXAM:
CHEST - 2 VIEW

[w chest pa]
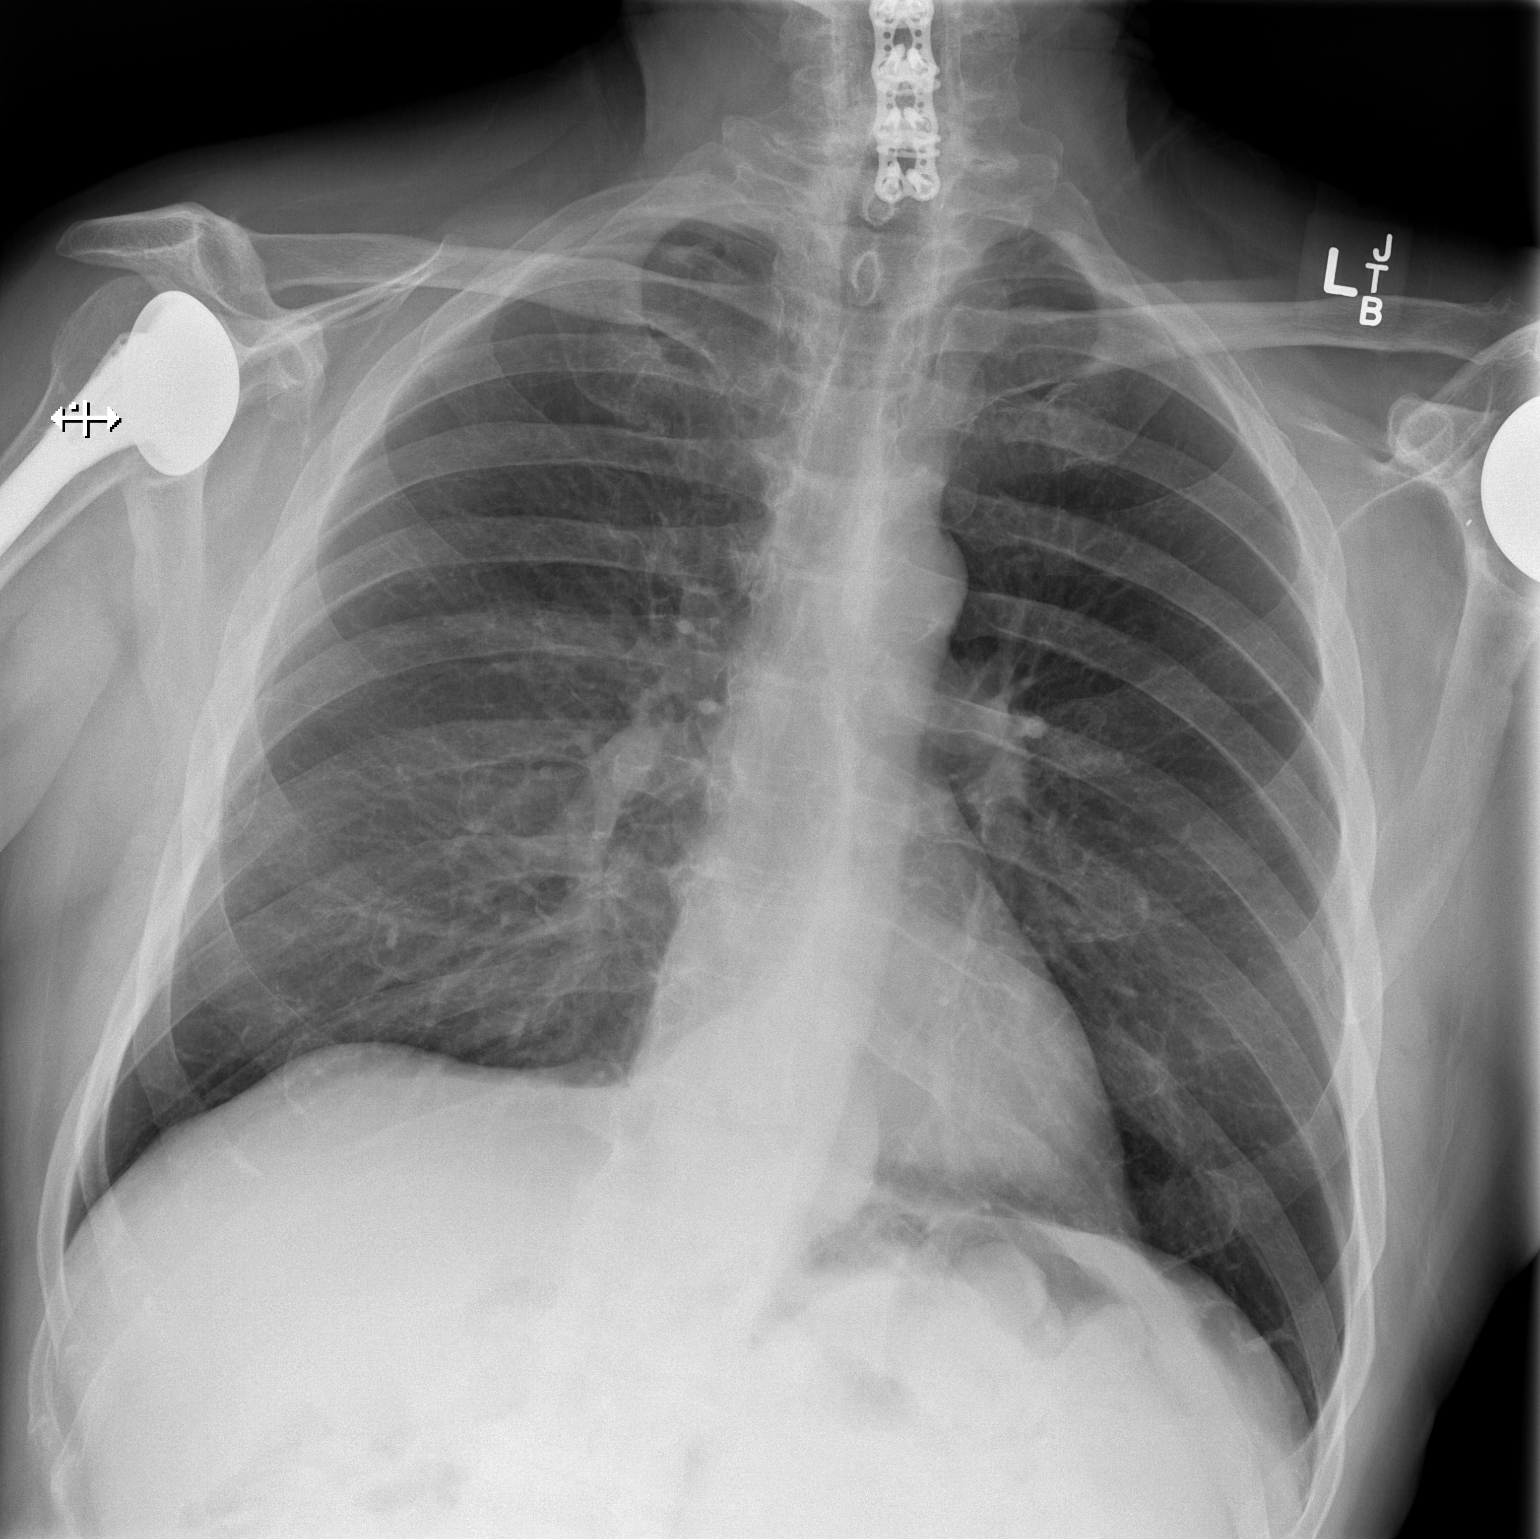

[w chest lat]
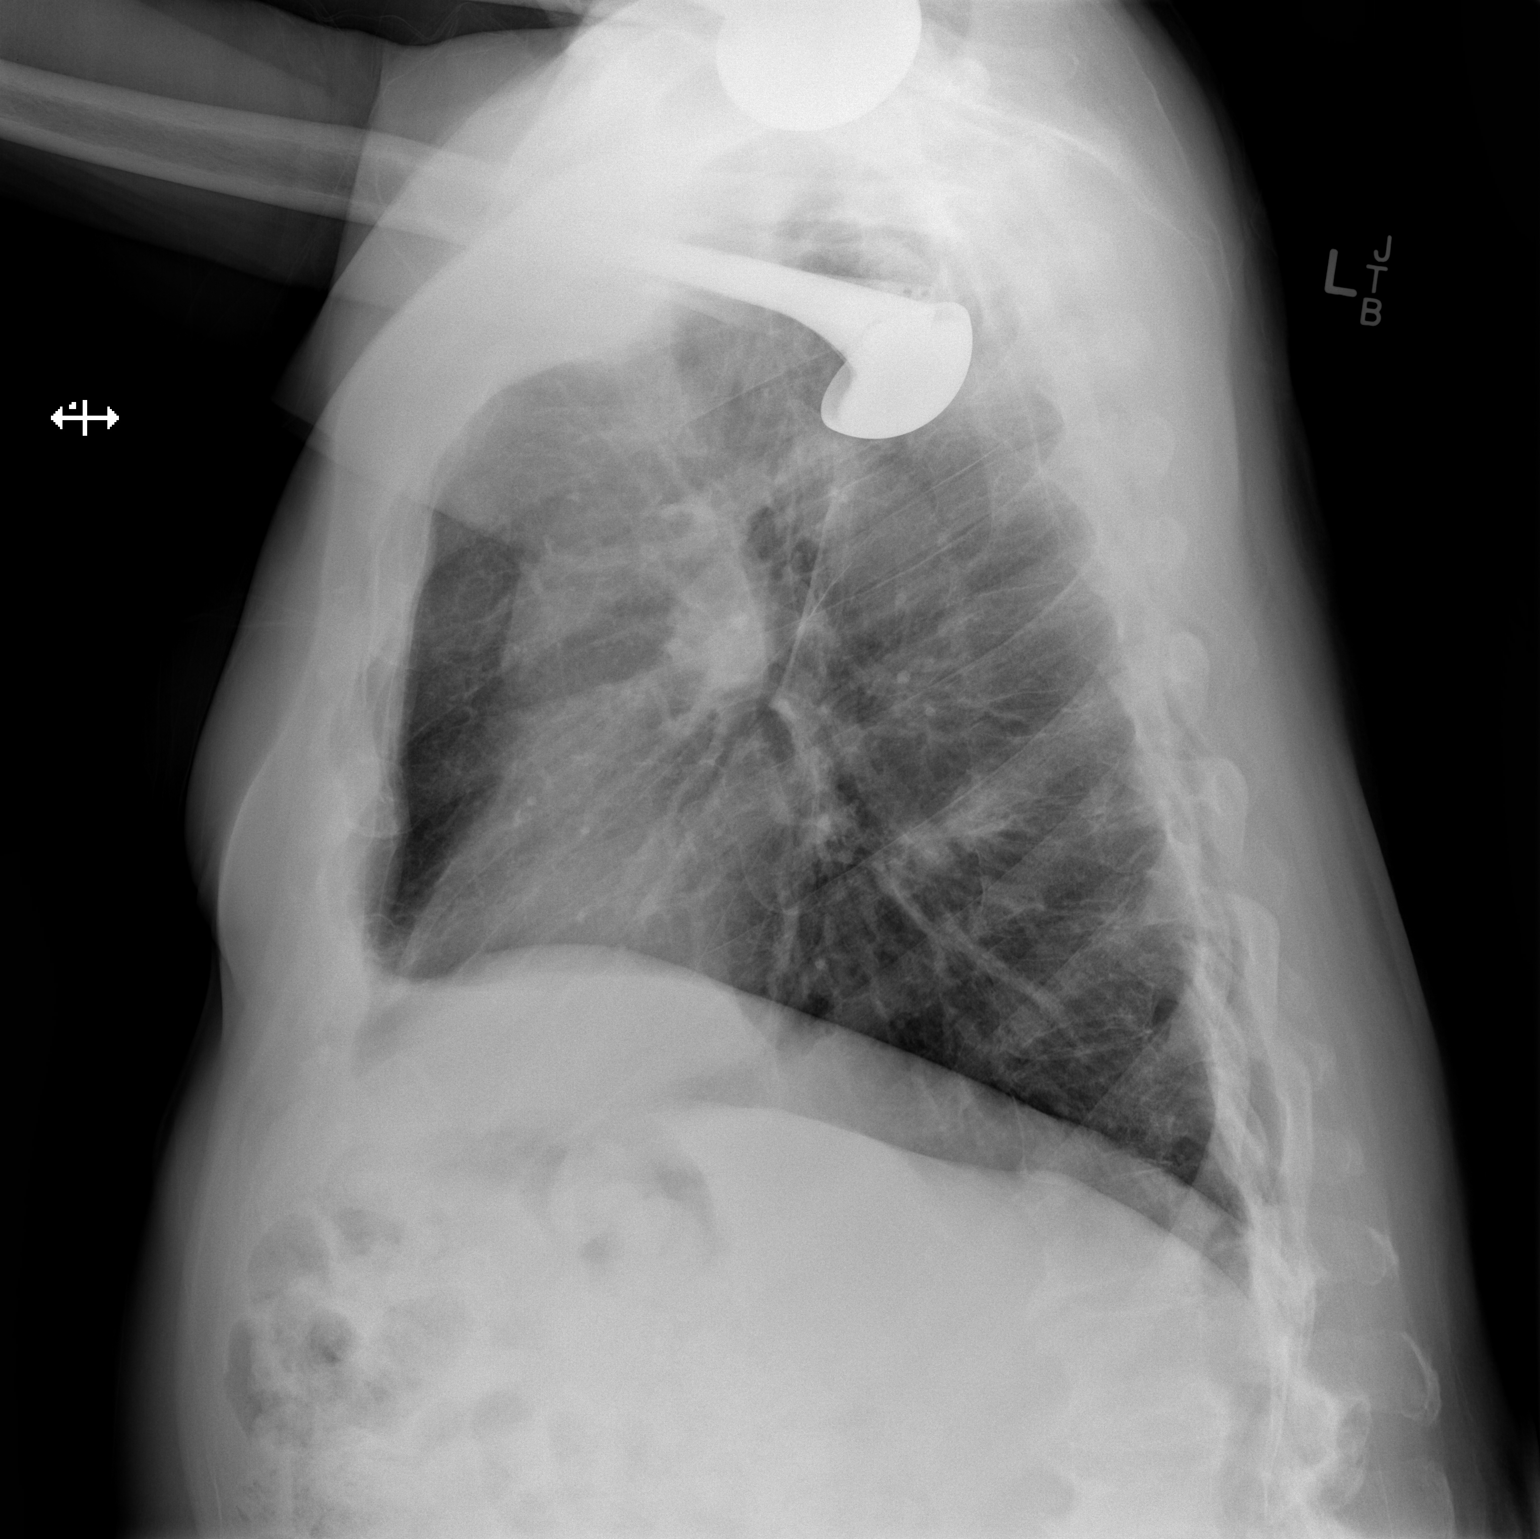

[2 of 2 positions shown; findings below may reference images not displayed]

FINDINGS: The heart size and mediastinal contours are within normal limits.
Both lungs are clear. The visualized skeletal structures are
unremarkable.
IMPRESSION: No active cardiopulmonary disease.

## 2023-01-15 ENCOUNTER — Other Ambulatory Visit: Payer: Self-pay

## 2023-01-15 ENCOUNTER — Encounter (HOSPITAL_COMMUNITY): Payer: Self-pay

## 2023-01-15 ENCOUNTER — Emergency Department (HOSPITAL_COMMUNITY)
Admission: EM | Admit: 2023-01-15 | Discharge: 2023-01-15 | Disposition: A | Discharge status: Home / Self Care | Payer: Medicare Other | Attending: Emergency Medicine | Admitting: Emergency Medicine

## 2023-01-15 ENCOUNTER — Emergency Department (HOSPITAL_COMMUNITY): Payer: Medicare Other

## 2023-01-15 DIAGNOSIS — W260XXA Contact with knife, initial encounter: Secondary | ICD-10-CM | POA: Insufficient documentation

## 2023-01-15 DIAGNOSIS — S61219A Laceration without foreign body of unspecified finger without damage to nail, initial encounter: Secondary | ICD-10-CM

## 2023-01-15 DIAGNOSIS — S61321A Laceration with foreign body of left index finger with damage to nail, initial encounter: Secondary | ICD-10-CM | POA: Diagnosis not present

## 2023-01-15 DIAGNOSIS — S60941A Unspecified superficial injury of left index finger, initial encounter: Secondary | ICD-10-CM | POA: Diagnosis present

## 2023-01-15 MED ORDER — STERILE WATER FOR INJECTION IJ SOLN
INTRAMUSCULAR | Status: AC
  Administered 2023-01-15: 2.5 mL
  Filled 2023-01-15: qty 10

## 2023-01-15 MED ORDER — CEFAZOLIN SODIUM 1 G IM
1.0000 g | Freq: Once | INTRAMUSCULAR | Status: AC
  Administered 2023-01-15: 1 g via INTRAMUSCULAR
  Filled 2023-01-15: qty 1000

## 2023-01-15 MED ORDER — LIDOCAINE HCL (PF) 1 % IJ SOLN
5.0000 mL | Freq: Once | INTRAMUSCULAR | Status: AC
  Administered 2023-01-15: 5 mL
  Filled 2023-01-15: qty 5

## 2023-01-15 NOTE — ED Triage Notes (Addendum)
Pt presents with deep laceration to left index finger he sustained when trying to remove the blade from a utility knife today around 1300. He was sent here from Urgent Care and was told he would need surgery. Finger is wrapped and bleeding controlled. He received a tetanus shot at urgent care today.

## 2023-01-15 NOTE — ED Notes (Signed)
ORTHO TEC consulted

## 2023-01-15 NOTE — Progress Notes (Addendum)
Orthopedic Tech Progress Note Patient Details:  Russell Peterson Dec 25, 1945 [id number]  Ortho Devices Type of Ortho Device: Other (comment) Ortho Device/Splint Location: lue dorsal short arm splint Ortho Device/Splint Interventions: Ordered, Application, Adjustment  I applied the splint as requested by the dr. [name] Interventions Patient Tolerated: Well Instructions Provided: Care of device, Adjustment of device  [name] 01/15/2023, 9:12 PM

## 2023-01-15 NOTE — ED Notes (Signed)
Pt d/c home per MD order. Discharge summary reviewed, pt verbalizes understanding. Ambulatory off unit. Discharged home with wife.

## 2023-01-15 NOTE — ED Provider Triage Note (Signed)
Emergency Medicine Provider Triage Evaluation Note  Russell Peterson , a 77 y.o. male  was evaluated in triage.  Pt complains of left index finger injury.  He was seen at urgent care and was concerned for flexor tendon injury.  He was sent to the emergency department because he was told he needed "emergency surgery.".  Review of Systems  Positive: Finger lac Negative: fever  Physical Exam  There were no vitals taken for this visit. Gen:   Awake, no distress   Resp:  Normal effort  MSK:   Moves extremities without difficulty  Other:    Medical Decision Making  Medically screening exam initiated at 3:40 PM.  Appropriate orders placed.  Russell Peterson was informed that the remainder of the evaluation will be completed by another provider, this initial triage assessment does not replace that evaluation, and the importance of remaining in the ED until their evaluation is complete.     Arthor Captain, PA-C 01/15/23 [phone]

## 2023-01-15 NOTE — Consult Note (Signed)
Orthopaedic Surgery Hand and Upper Extremity History and Physical Examination 01/15/2023  Referring Provider: No referring provider defined for this encounter.  CC: Left index finger injury  HPI: Russell Peterson is a 77 y.o. male who presents to the ED after injuring his finger on a box cutter.  He injured his left index finger.  He first presented to Urgent Care who gave him a tetanus booster and sent him to the ED.    Past Medical History: Past Medical History:  Diagnosis Date   Anxiety    Arthritis    Costochondritis    Hyperlipidemia    Hypertension    dr Gordy Councilman    in [place]  [place]   Pre-diabetes      Medications: Scheduled Meds:  ceFAZolin (ANCEF) IM  1 g Intramuscular Once   sterile water (preservative free)       Continuous Infusions: PRN Meds:.sterile water (preservative free)  Allergies: Allergies as of 01/15/2023   (No Known Allergies)    Past Surgical History: Past Surgical History:  Procedure Laterality Date   ANTERIOR CERVICAL DECOMPRESSION/DISCECTOMY FUSION 4 LEVELS  04/26/2012   Procedure: ANTERIOR CERVICAL DECOMPRESSION/DISCECTOMY FUSION 4 LEVELS;  Surgeon: Emilee Hero, MD;  Location: [facility] OR;  Service: Orthopedics;  Laterality: Bilateral;  Anterior cervical decompression fusion, cervical 3-4, cervical 4-5, cervical 5-6, cervical 6-7 with instrumentation and allograft.   CARPAL TUNNEL RELEASE     COLON SURGERY  2010   partial colon resection secondary diverticulitis.    ESOPHAGEAL MANOMETRY N/A 04/30/2013   Procedure: ESOPHAGEAL MANOMETRY (EM);  Surgeon: Theda Belfast, MD;  Location: [facility];  Service: Endoscopy;  Laterality: N/A;   JOINT REPLACEMENT     L shoulder, R knee   POSTERIOR CERVICAL FUSION/FORAMINOTOMY N/A 10/05/2018   Procedure: Posterior cervical-thoracic fusion Cervical five 50 Thoracic Two;  Surgeon: Venita Lick, MD;  Location: [facility] OR;  Service: Orthopedics;  Laterality: N/A;    TONSILLECTOMY       Social  History: Social History   Occupational History   Not on file  Tobacco Use   Smoking status: Never   Smokeless tobacco: Never  Substance and Sexual Activity   Alcohol use: Yes    Alcohol/week: 4.0 standard drinks of alcohol    Types: 4 Cans of beer per week    Comment: socially   Drug use: No   Sexual activity: Not on file     Family History: Family History  Problem Relation Age of Onset   Heart disease Mother    Kidney disease Mother    Diabetes Father    Stroke Father    Diabetes Sister    Hyperlipidemia Sister    Hypertension Sister    Stroke Sister    Hypertension Daughter    Otherwise, no relevant orthopaedic family history  ROS: Review of Systems: All systems reviewed and are negative except that mentioned in HPI  Work/Sport/Hobbies: See HPI  Physical Examination: Vitals:   01/15/23 1540  BP: 133/89  Pulse: 77  Resp: 17  Temp: 98 F (36.7 C)  SpO2: 99%   Constitutional: Awake, alert.  WN/WD Appearance: healthy, no acute distress, well-groomed Affect: Normal HEENT: EOMI, mucous membranes moist CV: RRR Pulm: breathing comfortably  Neck:   FROM, no pain  Left Upper Extremity / Hand There is a transverse laceration across the palmar surface of the PIP joint of the index finger. There is no visible cut tendon ends in the wound.  When isolating the index finger, patient is  unable to flex at the DIP.  He is able flex at the isolated PIP joint.  Sensation diminished on the radial and ulnar aspects of the tip but reports intact sensation on the pad.  Cap refill similar to remaining digits.  Dopplerable signal in the pad of the index finger.   Pertinent Labs: n/a  Imaging: I have personally reviewed the following studies: Left index finger imaging demonstrates no fractures, dislocations, or acute osseous abnormalities.  There are 2 small foreign bodies at the level of the MCP joints.  Additional Studies: n/a  Assessment/Plan: Laceration of finger of  left hand, foreign body presence unspecified, nail damage status unspecified, unspecified finger, initial encounter  Patient has at least a Left index finger flexor tendon (FDP) injury, possible partial FDS injury, and possible digital nerve injury.  Please wash out wound and close laceration and place in a well padded dorsal blocking splint from forearm to fingertips in slight flexion.  Please give 1 dose IV Ancef.  Plan to take patient to OR as an outpatient on Tuesday for wound exploration and flexor tendon repair and possible digital nerve repair.  Patient is not a blood thinner, does not have diabetes or any cardiopulmonary issues except for OSA.  Patient is acceptable for ambulatory surgery center.   Shaune Pollack, MD Hand and Upper Extremity Surgery [facility] [phone] 01/15/2023 8:49 PM

## 2023-01-15 NOTE — ED Provider Notes (Signed)
[place] EMERGENCY DEPARTMENT AT [facility] Provider Note   CSN: [id number] Arrival date & time: 01/15/23  1503     History Chief Complaint  Patient presents with   Extremity Laceration    HPI NARENDER PEDUZZI is a 77 y.o. male presenting for finger laceration.  Second digit left hand.  He was opening a box cutter to put a new utility knife blade in it and lost his grip on it.  Says that he got stuck and he was pulling hard when it came out it dragged the right across his proximal left second digit.  He immediately lost the ability to flex his finger.  He is right-hand dominant.  Denies fevers chills nausea vomiting syncope or shortness of breath.   Patient's recorded medical, surgical, social, medication list and allergies were reviewed in the Snapshot window as part of the initial history.   Review of Systems   Review of Systems  Constitutional:  Negative for chills and fever.  HENT:  Negative for ear pain and sore throat.   Eyes:  Negative for pain and visual disturbance.  Respiratory:  Negative for cough and shortness of breath.   Cardiovascular:  Negative for chest pain and palpitations.  Gastrointestinal:  Negative for abdominal pain and vomiting.  Genitourinary:  Negative for dysuria and hematuria.  Musculoskeletal:  Negative for arthralgias and back pain.  Skin:  Negative for color change and rash.  Neurological:  Negative for seizures and syncope.  All other systems reviewed and are negative.   Physical Exam Updated Vital Signs BP (!) 164/97   Pulse 66   Temp 98.3 F (36.8 C) (Oral)   Resp 16   Ht 5\' 8"  (1.727 m)   Wt 79.8 kg   SpO2 99%   BMI 26.76 kg/m  Physical Exam Vitals and nursing note reviewed.  Constitutional:      General: He is not in acute distress.    Appearance: He is well-developed.  HENT:     Head: Normocephalic and atraumatic.  Eyes:     Conjunctiva/sclera: Conjunctivae normal.  Cardiovascular:     Rate and Rhythm: Normal  rate and regular rhythm.     Heart sounds: No murmur heard. Pulmonary:     Effort: Pulmonary effort is normal. No respiratory distress.     Breath sounds: Normal breath sounds.  Abdominal:     Palpations: Abdomen is soft.     Tenderness: There is no abdominal tenderness.  Musculoskeletal:        General: Deformity (deformity of the first digit  left finger) present. No swelling.     Cervical back: Neck supple.  Skin:    General: Skin is warm and dry.     Capillary Refill: Capillary refill takes less than 2 seconds.  Neurological:     Mental Status: He is alert.  Psychiatric:        Mood and Affect: Mood normal.      ED Course/ Medical Decision Making/ A&P Clinical Course as of 01/15/23 2327  Sat Jan 15, 2023  1955 Surgery on tuesday Ancef Cleanout Splint dorsal Close locally [CC]    Clinical Course User Index [CC] Glyn Ade, MD    Procedures .Marland KitchenLaceration Repair  Date/Time: 01/15/2023 11:24 PM  Performed by: Glyn Ade, MD Authorized by: Glyn Ade, MD   Consent:    Consent obtained:  Verbal   Consent given by:  Patient   Risks, benefits, and alternatives were discussed: yes     Risks  discussed:  Need for additional repair and pain Universal protocol:    Patient identity confirmed:  Verbally with patient Anesthesia:    Anesthesia method:  None Laceration details:    Location:  Finger   Finger location:  L index finger   Length (cm):  2.8 Exploration:    Wound extent: muscle damage and tendon damage   Repair type:    Repair type:  Simple Post-procedure details:    Dressing:  Open (no dressing)   Procedure completion:  Tolerated    Medications Ordered in ED Medications  ceFAZolin (ANCEF) injection 1 g (1 g Intramuscular Given 01/15/23 2105)  lidocaine (PF) (XYLOCAINE) 1 % injection 5 mL (5 mLs Other Given by Other 01/15/23 2025)  sterile water (preservative free) injection (2.5 mLs  Given 01/15/23 2106)    Medical Decision Making:    Laceration across the left hand second digit with flexor tendon taken out completely.  Consulted hand surgery who evaluated at bedside.  They recommended primary closure in the emergency room, irrigation, Ancef administration, dorsal splinting in 30 degrees of flexion with plan for operative intervention on Tuesday at next OR availability.  Patient is in agreement.  Tdap updated earlier today.  X-ray performed to evaluate for fracture and grossly negative.    Disposition:  I have considered need for hospitalization, however, considering all of the above, I believe this patient is stable for discharge at this time.  Patient/family educated about specific return precautions for given chief complaint and symptoms.  Patient/family educated about follow-up with PCP and hand.     Patient/family expressed understanding of return precautions and need for follow-up. Patient spoken to regarding all imaging and laboratory results and appropriate follow up for these results. All education provided in verbal form with additional information in written form. Time was allowed for answering of patient questions. Patient discharged.    Emergency Department Medication Summary:   Medications  ceFAZolin (ANCEF) injection 1 g (1 g Intramuscular Given 01/15/23 2105)  lidocaine (PF) (XYLOCAINE) 1 % injection 5 mL (5 mLs Other Given by Other 01/15/23 2025)  sterile water (preservative free) injection (2.5 mLs  Given 01/15/23 2106)         Clinical Impression:  1. Laceration of finger of left hand, foreign body presence unspecified, nail damage status unspecified, unspecified finger, initial encounter      Discharge   Final Clinical Impression(s) / ED Diagnoses Final diagnoses:  Laceration of finger of left hand, foreign body presence unspecified, nail damage status unspecified, unspecified finger, initial encounter    Rx / DC Orders ED Discharge Orders     None         Glyn Ade,  MD 01/15/23 2327

## 2023-01-15 NOTE — ED Notes (Signed)
EDP at bedside  

## 2024-01-06 ENCOUNTER — Other Ambulatory Visit (HOSPITAL_COMMUNITY): Payer: Self-pay | Admitting: Orthopedic Surgery

## 2024-01-06 DIAGNOSIS — Z96659 Presence of unspecified artificial knee joint: Secondary | ICD-10-CM

## 2024-01-06 DIAGNOSIS — T8484XD Pain due to internal orthopedic prosthetic devices, implants and grafts, subsequent encounter: Secondary | ICD-10-CM

## 2024-01-17 ENCOUNTER — Ambulatory Visit (HOSPITAL_COMMUNITY)
Admission: RE | Admit: 2024-01-17 | Discharge: 2024-01-17 | Disposition: A | Discharge status: Home / Self Care | Source: Ambulatory Visit | Attending: Orthopedic Surgery | Admitting: Orthopedic Surgery

## 2024-01-17 DIAGNOSIS — T8484XD Pain due to internal orthopedic prosthetic devices, implants and grafts, subsequent encounter: Secondary | ICD-10-CM | POA: Diagnosis present

## 2024-01-17 DIAGNOSIS — Z96659 Presence of unspecified artificial knee joint: Secondary | ICD-10-CM | POA: Diagnosis present

## 2024-01-17 MED ORDER — TECHNETIUM TC 99M MEDRONATE IV KIT
22.0000 | PACK | Freq: Once | INTRAVENOUS | Status: AC | PRN
  Administered 2024-01-17: 22 via INTRAVENOUS

## 2024-02-13 ENCOUNTER — Other Ambulatory Visit: Payer: Self-pay | Admitting: Orthopedic Surgery

## 2024-02-13 DIAGNOSIS — M542 Cervicalgia: Secondary | ICD-10-CM

## 2024-03-02 ENCOUNTER — Telehealth: Payer: Self-pay

## 2024-03-02 NOTE — Discharge Instructions (Signed)

## 2024-03-05 ENCOUNTER — Ambulatory Visit
Admission: RE | Admit: 2024-03-05 | Discharge: 2024-03-05 | Disposition: A | Discharge status: Home / Self Care | Source: Ambulatory Visit | Attending: Orthopedic Surgery | Admitting: Orthopedic Surgery

## 2024-03-05 DIAGNOSIS — M542 Cervicalgia: Secondary | ICD-10-CM

## 2024-03-05 MED ORDER — DIAZEPAM 5 MG PO TABS: 5.0000 mg | ORAL_TABLET | Freq: Once | ORAL | Status: DC

## 2024-03-05 MED ORDER — MEPERIDINE HCL 50 MG/ML IJ SOLN: 50.0000 mg | Freq: Once | INTRAMUSCULAR | Status: DC | PRN

## 2024-03-05 MED ORDER — ONDANSETRON HCL 4 MG/2ML IJ SOLN: 4.0000 mg | Freq: Once | INTRAMUSCULAR | Status: DC | PRN

## 2024-03-05 MED ORDER — IOPAMIDOL (ISOVUE-M 300) INJECTION 61%
10.0000 mL | Freq: Once | INTRAMUSCULAR | Status: AC | PRN
  Administered 2024-03-05: 10 mL via INTRATHECAL

## 2024-04-10 NOTE — Progress Notes (Signed)
 PATIENT: Russell Peterson. MR #: [id number] SEX: male  AGE: 78 y.o. DATE OF SERVICE: 04/10/2024 DOB: 1945/10/05  OTOLARYNGOLOGY / FACIAL PLASTIC & RECONSTRUCTIVE SURGERY NEW PATIENT CLINIC NOTE  CONSULTATION REQUESTED BY: Jacolyn Nat Purdue PA  04/10/2024  Dear Jacolyn Nat Purdue PA  I had the absolute pleasure of seeing Russell Peterson. in the Otolaryngology, Head & Neck Surgery Clinic at [facility] in initial evaluation.  Thank you for your kind referral of this pleasant patient. Please see my full evaluation as noted below:  CHIEF COMPLAINT: Right neck mass  History of Present Illness This is a very pleasant 78 year old male presents with a chief complaint of a right neck mass that developed promptly after undergoing general anesthesia for elective hernia repair March 30, 2024.  He states it has been painless but persistent.  Denies any associated neck pain, fevers, chills, night sweats, weight loss, dyne aphasia, dysphagia, hemoptysis, sore throat.  Denies tobacco use history.  Denies alcohol use.  No history of head neck cancer.  Has not had any recent imaging.  He has an extensive history of spine surgery including a prior ACDF on the left side, revision performed by Dr. Burnetta.  In addition wears hearing aids obtained through the [place].  Has dry flaky itchy ears.  Uses Neosporin and washes them nightly to try to relieve it.  Has not tried any steroid creams.   PAST MEDICAL HISTORY. Medical History[1]  PAST SURGICAL HISTORY Surgical History[2]  ALLERGIES Codeine  MEDICATIONS Current Medications[3]   SOCIAL HISTORY: Social History[4]  FAMILY HISTORY Family History[5]   Review Of Systems: All systems were reviewed and were negative except for those mentioned in the HPI.    Past medical history, past surgical history, drug allergies, medications, and pertinent social and family medical history were  reviewed with the  patient  PHYSICAL EXAMINATION: BP 130/82   Pulse 75   Temp 97.3 F (36.3 C) (Temporal)   Resp 16   Ht 1.727 m (5' 7.99)   Wt 84.8 kg (187 lb)   SpO2 98%   BMI 28.44 kg/m    General/Constitutional: Patient is a well-nourished, well-developed male appearing stated age in no acute distress. Voice quality is good.   Psych: Alert & oriented to time, place and person. Answers questions appropriately.  Skin/scalp : Normal and without lesions. No rashes, ulcerations or masses noted.  Head: Normocephalic. No asymmetries noted. No tenderness to palpation of the soft tissue or bony structures of the face or skull.  Eyes: Normal extraocular motions without diplopia.  Pupils equal round and reactive to light.  No nystagmus noted.  ENT: Bilateral eczema of outer ears.  Ears: Right Ear: Normal pinna, EAC clear; TM intact, no perforation, no effusion, no global retraction, no retraction pockets. Left Ear: Normal pinna, EAC clear; TM intact, no perforation, no effusion, no global retraction, no retraction pockets.   Nose and Sinuses: No paranasal sinus tenderness.  Mucosa without lesions.  No polyps are seen. Septum midline and straight.   Mouth: Teeth, gums, tongue, floor of the mouth, retromolar trigone, buccal mucosa, hard palate - normal without lesions. Normal jaw opening. Occlusion - normal.  Oropharynx: Tonsillar fossae devoid of tonsils, pharyngeal walls, base of tongue - normal. No bleeding or masses.  Neck/Musculoskeletal: 4-5 cm right level II-III neck mass along SCM . Mobile. Non-tender.   Cranial Nerves/Neuro: Function II through XII - normal. No focal deficits noted.   Laryngoscopy Flexible Diagnostic  Date/Time:  04/10/2024 2:00 PM  Performed by: Elspeth Cordella Coddington, MD Authorized by: Elspeth Cordella Coddington, MD   Consent:    Consent obtained:  Verbal   Consent given by:  Patient   Risks discussed:  Bleeding   Alternatives discussed:  Observation Procedure details:     Indications: direct visualization of the upper aerodigestive tract     Medication:  Neo-Synephrine 1%   Instrument: flexible fiberoptic laryngoscope   Nasal cavity:    Right inferior turbinates:     normal   Left inferior turbinates:     normal   Septum:    normal   Sinus:    Right middle meatus:      normal       patent     Left middle meatus:      normal       patent   Right nasopharynx:      normal     patent   Left nasopharynx:      normal     patent   Right Eustachian tube orifices:      normal     patent   Left Eustachian tube orifices:      normal     patent Mouth:    Posterior pharyngeal wall: normal     Oropharynx:      normal     Vallecula:      normal     Base of tongue:      normal     Epiglottis:      normal   Throat:    Right hypopharynx:      normal     Left hypopharynx:      normal     Pyriform sinus:      normal     False vocal cords:      normal     True vocal cords:      normal   Post-procedure details:    Patient tolerance of procedure:  Tolerated well, no immediate complications Comments:     Dry mucous in throat otherwise unremarkable. No base of tongue lesion.   Endoscopic images:      IMAGING/RESULTS  - Reviewed in detail as follows: CT C SPINE 03/05/24 - per my read on non-contrast enhanced CT there appears to be enlarged lymph nodes in the right level 2-3 neck    FINDINGS:  BONES AND ALIGNMENT: There is normal alignment of the spine. The vertebral body heights are maintained. No destructive osseous lesion is seen. Posterior fusion hardware extends to T1 and T2. Solid posterior fusion is present across the C7-T1 level. No definite fusion is present at T1-2. The anterior screws at C7 are stable since the intraoperative images 10/05/2018. Solid fusion is present at C3-C4, C4-C5, C5-C6 and C6-C7 with anteriorly and posteriorly. Solid fusion is present posteriorly at C2-C3, C3-C4 and C4-C5.  SOFT TISSUES: There is no  prevertebral soft tissue swelling.  C2-C3: There is no significant disc protrusion, spinal canal stenosis or neural foraminal narrowing.  C3-C4: There is no significant disc protrusion, spinal canal stenosis or neural foraminal narrowing.  C4-C5: Uncovertebral spurring results in stable moderate left foraminal narrowing at C4-C5.  C5-C6: Uncovertebral spurring results in stable moderate right foraminal narrowing at C5-C6.  C6-C7: There is no significant disc protrusion, spinal canal stenosis or neural foraminal narrowing.  C7-T1: Moderate foraminal narrowing on the right at C7-T1.  IMPRESSION: 1. Postoperative changes with solid fusion across C3C7 (anterior/posterior as described) and C7T1 posteriorly; hardware  appears stable. 2. Moderate foraminal narrowing at left C45, right C56, and right C7T1.  Electronically signed by: Lonni Necessary MD 03/09/2024 12:47 PM EDT RP Workstation: [id number] Exam End: 03/05/24 11:06 Last Resulted: 03/09/24 12:47    MEDICAL DECISION MAKING IMPRESSION: 78 year old male presents with chief complaint of approximately 10 day history history of painless right neck mass.  Upon review of CT C Spine 03/05/24 it appears the mass was present then. 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  Differential also includes lymphoma, benign neoplasm, versus intramuscular hematoma.  Hematoma not likely due to absence of pain bruising or known trauma. CT S-Spine from 03/05/24 demonstrates presence of the mass suggesting  Incidentally patient has eczematous otitis externa of both ears which is not treated currently. 1. Mass of right side of neck   2. Chronic eczematous otitis externa of both ears      RECOMMENDATIONS: I recommend an urgent CT of the neck with contrast to further evaluate this lesion.  Imaging  findings will then guide subsequent workup.  If it appears to be a neoplasm or lymph node I will recommend urgent ultrasound-guided fine-needle biopsy and we will order this at her subsequent telehealth visit follow-up.  Discussed my concerns with the patient and the urgency of getting this evaluated.  Topical steroid cream prescribed for bilateral eczematous otitis externa.  Instructed patient to stop scrubbing ears at night.  Stop using the Neosporin.  Phone follow-up after CT scan.  Orders Placed This Encounter  Procedures  . Laryngoscopy Flexible Diagnostic  . CT Soft Tissue Neck W And WO Contrast    Orders Placed This Encounter  Medications  . triamcinolone acetonide (KENALOG) 0.5 % cream    Sig: APPLY A THIN LAYER TO THE AFFECTED PORTION OF YOUR OUTER EAR 1-2 TIMES DAILY PRN ITCHING/FLAKING    Dispense:  15 g    Refill:  2     Medical Decision Making: 3 #/Compl Problems  3                          Data Rev  3                 Management 2             (1-Straightforward, 2-Low, 3- Moderate, 4-High)  All questions were answered. Patient is amenable to plan as discussed above.  Thank you for this kind consultation of this patient to our department, and we feel honored to be of service. Please feel free to contact me at any point with to discuss or answer any questions pertaining to this patient or any other matter.   Sincerely,  Electronically signed by:  Elspeth Coddington, MD  Facial Plastic & Reconstructive Surgery Otolaryngology - Head and Neck Surgery [facility] [place]       [1] Past Medical History: Diagnosis Date  . Arthritis   . Diverticulitis   . Hypertension   . Skin cancer   . Sleep apnea   [2] Past Surgical History: Procedure Laterality Date  . BACK SURGERY     Procedure: BACK SURGERY  . BACK SURGERY     Procedure: BACK SURGERY  . CARPAL TUNNEL RELEASE Bilateral    Procedure: CARPAL TUNNEL  RELEASE  . CERVICAL SPINE SURGERY  2015  Procedure: CERVICAL SPINE SURGERY  . HAND TENDON SURGERY Left 01/18/2023   Left Index Finger Wound Exploration. Flexor Tendon Repair Zone 2, and Digital Nerve Reconstruction with Allograft performed by Marsa Ozell Christen, MD at [facility] OR  . HERNIA REPAIR Bilateral 03/30/2024  . JOINT REPLACEMENT     Procedure: JOINT REPLACEMENT  . TOTAL KNEE ARTHROPLASTY Right    Procedure: TOTAL KNEE ARTHROPLASTY  . TOTAL KNEE ARTHROPLASTY Left 07/16/2020   Procedure: TOTAL KNEE ARTHROPLASTY;  Surgeon: Jordan Miller Case, MD;  Location: [facility] MAIN OR;  Service: Orthopedics;  Laterality: Left;  . TOTAL SHOULDER ARTHROPLASTY Bilateral    Procedure: TOTAL SHOULDER ARTHROPLASTY  [3]  Current Outpatient Medications:  .  ascorbic acid (VITAMIN C) 1,000 mg tablet, Take 1 tablet by mouth 2 (two) times a day., Disp: , Rfl:  .  aspirin 81 mg EC tablet, 81 mg., Disp: , Rfl:  .  atorvastatin (LIPITOR) 20 mg tablet, Take 10 mg by mouth Once Daily., Disp: , Rfl:  .  carboxymethylcellulose (Refresh Plus) 0.5 % dpet, Administer 1 drop into each eyes 4 (four) times a day., Disp: , Rfl:  .  cholecalciferol (VITAMIN D3) 2,000 unit cap capsule, Take 1 capsule by mouth 2 (two) times a day., Disp: , Rfl:  .  diclofenac sodium (VennGel One) 1 % kit, Apply 2 g topically 3 (three) times a day., Disp: , Rfl:  .  gabapentin  (NEURONTIN ) 400 mg capsule, Take 800 mg by mouth 3 (three) times a day for 14 days., Disp: 84 capsule, Rfl: 0 .  lisinopriL -hydrochlorothiazide  (PRINZIDE) 20-25 mg per tablet, Take 1 tablet by mouth daily., Disp: , Rfl:  .  methadone (DOLOPHINE) 10 mg tablet, Take 10 mg by mouth 2 (two) times a day, Disp: , Rfl:  .  nortriptyline  (PAMELOR ) 50 mg capsule, Take 50 mg by mouth nightly., Disp: , Rfl:  .  oxybutynin  (DITROPAN ) 5 mg tablet, Take 5 mg by mouth Once Daily., Disp: , Rfl:  .  oxyCODONE  (ROXICODONE ) 5 mg immediate release tablet, Take 5 mg by mouth  in the morning and 5 mg at noon and 5 mg in the evening and 5 mg before bedtime., Disp: , Rfl:  .  sennosides-docusate sodium  (PERICOLACE) 8.6-50 mg per tablet, Take 2 tablets by mouth 2 (two) times a day for 14 days., Disp: 56 tablet, Rfl: 0 .  zolpidem  (AMBIEN ) 10 mg tablet, Take 10 mg by mouth nightly as needed for sleep., Disp: , Rfl:  .  triamcinolone acetonide (KENALOG) 0.5 % cream, APPLY A THIN LAYER TO THE AFFECTED PORTION OF YOUR OUTER EAR 1-2 TIMES DAILY PRN ITCHING/FLAKING, Disp: 15 g, Rfl: 2 [4] Social History Tobacco Use  . Smoking status: Never  . Smokeless tobacco: Never  Substance Use Topics  . Alcohol use: Never  . Drug use: Never  [5] Family History Problem Relation Name Age of Onset  . Diabetes Father    . Diabetes Sister

## 2024-04-12 ENCOUNTER — Other Ambulatory Visit (HOSPITAL_COMMUNITY): Payer: Self-pay | Admitting: Otolaryngology

## 2024-04-12 ENCOUNTER — Ambulatory Visit (HOSPITAL_BASED_OUTPATIENT_CLINIC_OR_DEPARTMENT_OTHER)
Admission: RE | Admit: 2024-04-12 | Discharge: 2024-04-12 | Disposition: A | Discharge status: Home / Self Care | Source: Ambulatory Visit | Attending: Otolaryngology | Admitting: Otolaryngology

## 2024-04-12 DIAGNOSIS — R221 Localized swelling, mass and lump, neck: Secondary | ICD-10-CM | POA: Insufficient documentation

## 2024-04-12 MED ORDER — IOHEXOL 300 MG/ML  SOLN
75.0000 mL | Freq: Once | INTRAMUSCULAR | Status: AC | PRN
  Administered 2024-04-12: 75 mL via INTRAVENOUS

## 2024-04-18 ENCOUNTER — Other Ambulatory Visit (HOSPITAL_COMMUNITY): Payer: Self-pay | Admitting: Otolaryngology

## 2024-04-18 ENCOUNTER — Ambulatory Visit (HOSPITAL_COMMUNITY)
Admission: RE | Admit: 2024-04-18 | Discharge: 2024-04-18 | Disposition: A | Discharge status: Home / Self Care | Source: Ambulatory Visit | Attending: Otolaryngology | Admitting: Otolaryngology

## 2024-04-18 DIAGNOSIS — R221 Localized swelling, mass and lump, neck: Secondary | ICD-10-CM

## 2024-04-19 NOTE — Progress Notes (Unsigned)
 Russell [name], MD  Russell Peterson PROCEDURE / BIOPSY REVIEW Date: 04/19/24  Requested Biopsy site: right neck Reason for request: right neck mass Imaging review: Best seen on CT  Decision: Approved Imaging modality to perform: Ultrasound Schedule with: No sedation / Local anesthetic Schedule for: Any VIR  Additional comments:   Please contact me with questions, concerns, or if issue pertaining to this request arise.  [name] [name], MD [facility]       Previous Messages    ----- Message ----- From: Russell Peterson Sent: 04/19/2024   9:43 AM EST To: [name] Russell; Taryn F Rigney, RT; Ir Proc*  Procedure :US  CORE BIOPSY (SOFT TISSUE)  Reason :Right Neck Mass Dx: Mass of right side of neck [R22.1 (ICD-10-CM)]    History :US  SOFT TISSUE HEAD & NECK (NON-THYROID),CT CERVICAL SPINE W CONTRAST,CT SOFT TISSUE NECK W CONTRAST ,DG MYELOGRAPHY LUMBAR INJ CERVICAL,NM Bone Scan 3 Phase  Provider: Luciano Standing, MD  Provider contact ; [phone]

## 2024-04-25 NOTE — Progress Notes (Signed)
 Patient for US  guided Core RT Neck Mass Biopsy on Friday 04/27/24, I called and spoke with the patient on the phone and gave pre-procedure instructions. Pt was made aware to be here at 12:30p and check in at the [facility] registration desk. Pt stated understanding. Called 04/25/24

## 2024-04-27 ENCOUNTER — Ambulatory Visit
Admission: RE | Admit: 2024-04-27 | Discharge: 2024-04-27 | Disposition: A | Source: Ambulatory Visit | Attending: Otolaryngology

## 2024-04-27 DIAGNOSIS — R221 Localized swelling, mass and lump, neck: Secondary | ICD-10-CM | POA: Diagnosis present

## 2024-04-27 DIAGNOSIS — C770 Secondary and unspecified malignant neoplasm of lymph nodes of head, face and neck: Secondary | ICD-10-CM | POA: Insufficient documentation

## 2024-05-02 LAB — SURGICAL PATHOLOGY

## 2024-05-07 ENCOUNTER — Other Ambulatory Visit (HOSPITAL_COMMUNITY): Payer: Self-pay | Admitting: Otolaryngology

## 2024-05-07 DIAGNOSIS — C770 Secondary and unspecified malignant neoplasm of lymph nodes of head, face and neck: Secondary | ICD-10-CM

## 2024-05-09 ENCOUNTER — Ambulatory Visit: Admission: RE | Admit: 2024-05-09 | Discharge: 2024-05-09 | Attending: Otolaryngology

## 2024-05-09 VITALS — Ht 68.0 in | Wt 187.0 lb

## 2024-05-09 DIAGNOSIS — I700 Atherosclerosis of aorta: Secondary | ICD-10-CM | POA: Insufficient documentation

## 2024-05-09 DIAGNOSIS — C770 Secondary and unspecified malignant neoplasm of lymph nodes of head, face and neck: Secondary | ICD-10-CM | POA: Insufficient documentation

## 2024-05-09 LAB — GLUCOSE, CAPILLARY: Glucose-Capillary: 93 mg/dL (ref 70–99)

## 2024-05-09 MED ORDER — FLUDEOXYGLUCOSE F - 18 (FDG) INJECTION
9.7000 | Freq: Once | INTRAVENOUS | Status: AC | PRN
  Administered 2024-05-09: 10.48 via INTRAVENOUS

## 2024-05-16 ENCOUNTER — Other Ambulatory Visit: Payer: Self-pay

## 2024-05-18 ENCOUNTER — Other Ambulatory Visit (HOSPITAL_COMMUNITY)

## 2024-05-18 ENCOUNTER — Encounter (HOSPITAL_COMMUNITY): Payer: Self-pay

## 2024-05-29 NOTE — Progress Notes (Signed)
 IV placed for CT scan and pt sent to 4th floor for scan.

## 2024-06-21 NOTE — Progress Notes (Signed)
 "       Russell Peterson. 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 Dr. Rhea has explained to me that the drug(s) used may have good and bad effects. I have been given a copy of [facility] with printed information for the following drugs that includes drug-drug and drug-food interactions, a plan for missed doses and instructions on symptoms and adverse events that require me to contact the healthcare setting or seek immediate attention. I can contact a healthcare provider 24 hours a day by calling [phone].   Recommended Treatment Plan--It has been recommended you receive the following antineoplastic hazardous or biotherapy for treatment for your illness Antineoplastic HD/Biotherapy  Frequency # Expected Cycles  Cisplatin Every 21 days 4 cycles  Etoposide Days 1, 2, 3 every 21 days  4 cycles                  1. The Physician has offered me or my legal representative the opportunity to ask questions and to have those questions answered before obtaining this Consent for Administration for antineoplastic HD and/or biotherapy. I understand the nature of the treatment. I have been instructed/informed of the following:  The risks, complications, and expected benefits or effects of the treatment or procedure including planned supportive care medications.  Any alternatives to the treatment plan or procedure, including no treatment, and risks/benefits of alternatives. The right to refuse treatment or procedure will in no way jeopardize my access to healthcare services.  Procedures for handling medications in the home and for handling body secretions and waste.  2. I understand that my doctors cannot be sure the treatment will help  me.  3. I understand the antineoplastic HD and/or biotherapy medications and supportive medications recommended by my doctor can have short and long-term effects. My doctor talked to me about the most important side effects that may include: Low Blood Counts  Heart Effects Hair Loss Reproductive/Fertility Effects  Risk of Infection/Bleeding Lung Effects Allergic Type Reactions Sexual Effects  Fatigue Kidney/Bladder Effects Hearing Loss  Secondary Cancers  Sores in Mouth or Throat Pancreas/Liver/Intestine Effects Thyroid /Adrenal Effects Skin Effects  Nausea/Vomiting Constipation/Diarrhea  Muscle/Bone/Nerve Effects    4. I understand that side effects of antineoplastic HD and/or biotherapy may be serious at times resulting in life-threatening side effects, hospitalization, or death. There may be side effects from my chemotherapy and/or biotherapy that are not listed on this form. Each patient can respond differently and could have side effects that have not been reported by others. 5. For women of childbearing potential: I understand abstinence or 2 forms of reliable contraception must be used during therapy and 1 month following discontinuation of treatment. Male patients should use a condom with women of child bearing potential during therapy and 1 month following discontinuation of treatment.  6. I understand that if I am experiencing symptoms or adverse events related to oral or self-administered treatment or if I need to cancel or reschedule an appointment that I should contact my healthcare provider for advice on continuing or discontinuing my treatment or to make a new appointment. 7. I understand that by signing this document I agree to undergo antineoplastic HD and/or biotherapy, which may include FDA-approved biosimilar or generic products. 8. I understand the Goal of Treatment at this time is  to control the progression of my cancer or relieve symptoms and improve quality of  life.   ____________________________________________________________________ Patient or Legal Representative Signature  Date/Time   ____________________________________________________________________ Witness Signature      Date/Time   ____________________________________________________________________ Verified By Signature     Date/Time   ____________________________________________________________________ Interpreter Signature     Date/Time  Refer to Media Tab in [place] to view a signed copy of this document.            "

## 2024-06-23 NOTE — Telephone Encounter (Signed)
 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 Needs to start chemotherapy as soon as possible but should wait for finding a primary tumor by ENT through panendosecopy. A previous referral was made to [facility] ENT but they referred him to our center. Please address urgently.  Please forward to the appropriate team to obtain [facility] referral to ENT urgently.  Thank you  Russell Casino, MD

## 2024-07-02 NOTE — Telephone Encounter (Signed)
" ° °  Pt and wife calling in to get appointment times for tomorrow and Wednesday. Questioning if he should take a nausea pill prior to treatment. RN informed pt that all premedications will be provided in clinic, verbalized understanding and appreciation.  "

## 2024-07-03 ENCOUNTER — Other Ambulatory Visit: Payer: Self-pay

## 2024-07-03 ENCOUNTER — Inpatient Hospital Stay
Admission: RE | Admit: 2024-07-03 | Discharge: 2024-07-03 | Disposition: A | Payer: Self-pay | Source: Ambulatory Visit | Attending: Radiation Oncology | Admitting: Radiation Oncology

## 2024-07-03 DIAGNOSIS — C7A8 Other malignant neuroendocrine tumors: Secondary | ICD-10-CM
